# Patient Record
Sex: Male | Born: 1960 | Race: Black or African American | Hispanic: No | Marital: Single | Smoking: Never smoker
Health system: Southern US, Community
[De-identification: ages and names within clinical notes are randomized; demographics above are authoritative.]

## PROBLEM LIST (undated history)

## (undated) DIAGNOSIS — I10 Essential (primary) hypertension: Secondary | ICD-10-CM

## (undated) DIAGNOSIS — C801 Malignant (primary) neoplasm, unspecified: Secondary | ICD-10-CM

## (undated) DIAGNOSIS — N289 Disorder of kidney and ureter, unspecified: Secondary | ICD-10-CM

## (undated) HISTORY — PX: TRANSPLANTATION RENAL: SUR1385

## (undated) NOTE — Telephone Encounter (Signed)
 Formatting of this note might be different from the original. Patient has [facility] and is not able to use our center.  Electronically signed by Trey Setter at 08/30/2021  2:15 PM CDT

---

## 2000-09-23 ENCOUNTER — Encounter: Payer: Self-pay | Admitting: Emergency Medicine

## 2000-09-23 ENCOUNTER — Emergency Department (HOSPITAL_COMMUNITY): Admission: EM | Admit: 2000-09-23 | Discharge: 2000-09-23 | Payer: Self-pay | Admitting: Emergency Medicine

## 2000-10-22 ENCOUNTER — Encounter: Admission: RE | Admit: 2000-10-22 | Discharge: 2000-10-22 | Payer: Self-pay | Admitting: Family Medicine

## 2000-10-22 ENCOUNTER — Encounter: Payer: Self-pay | Admitting: Family Medicine

## 2000-10-30 ENCOUNTER — Encounter: Admission: RE | Admit: 2000-10-30 | Discharge: 2000-12-20 | Payer: Self-pay | Admitting: *Deleted

## 2001-11-12 ENCOUNTER — Encounter: Payer: Self-pay | Admitting: Orthopaedic Surgery

## 2001-11-12 ENCOUNTER — Encounter: Admission: RE | Admit: 2001-11-12 | Discharge: 2001-11-12 | Payer: Self-pay | Admitting: Orthopaedic Surgery

## 2004-07-05 ENCOUNTER — Ambulatory Visit: Payer: Self-pay | Admitting: Nurse Practitioner

## 2004-07-05 ENCOUNTER — Ambulatory Visit: Payer: Self-pay | Admitting: *Deleted

## 2004-07-06 ENCOUNTER — Ambulatory Visit: Payer: Self-pay | Admitting: Nurse Practitioner

## 2006-05-16 ENCOUNTER — Emergency Department (HOSPITAL_COMMUNITY): Admission: EM | Admit: 2006-05-16 | Discharge: 2006-05-16 | Payer: Self-pay | Admitting: Family Medicine

## 2006-05-18 ENCOUNTER — Emergency Department (HOSPITAL_COMMUNITY): Admission: EM | Admit: 2006-05-18 | Discharge: 2006-05-18 | Payer: Self-pay | Admitting: Family Medicine

## 2006-05-20 ENCOUNTER — Emergency Department (HOSPITAL_COMMUNITY): Admission: EM | Admit: 2006-05-20 | Discharge: 2006-05-20 | Payer: Self-pay | Admitting: Family Medicine

## 2006-05-31 ENCOUNTER — Emergency Department (HOSPITAL_COMMUNITY): Admission: EM | Admit: 2006-05-31 | Discharge: 2006-05-31 | Payer: Self-pay | Admitting: Emergency Medicine

## 2007-10-14 ENCOUNTER — Emergency Department (HOSPITAL_COMMUNITY): Admission: EM | Admit: 2007-10-14 | Discharge: 2007-10-14 | Payer: Self-pay | Admitting: Family Medicine

## 2007-10-18 ENCOUNTER — Ambulatory Visit: Payer: Self-pay | Admitting: Internal Medicine

## 2007-10-31 ENCOUNTER — Ambulatory Visit: Payer: Self-pay | Admitting: Internal Medicine

## 2007-11-04 ENCOUNTER — Ambulatory Visit: Payer: Self-pay | Admitting: Internal Medicine

## 2007-12-09 ENCOUNTER — Ambulatory Visit: Payer: Self-pay | Admitting: Internal Medicine

## 2008-06-01 ENCOUNTER — Ambulatory Visit: Payer: Self-pay | Admitting: Internal Medicine

## 2008-06-01 LAB — CONVERTED CEMR LAB
ALT: 21 units/L (ref 0–53)
AST: 17 units/L (ref 0–37)
Albumin: 4.6 g/dL (ref 3.5–5.2)
Alkaline Phosphatase: 75 units/L (ref 39–117)
BUN: 24 mg/dL — ABNORMAL HIGH (ref 6–23)
CO2: 21 meq/L (ref 19–32)
Calcium: 9.9 mg/dL (ref 8.4–10.5)
Chloride: 107 meq/L (ref 96–112)
Creatinine, Ser: 1.98 mg/dL — ABNORMAL HIGH (ref 0.40–1.50)
Glucose, Bld: 101 mg/dL — ABNORMAL HIGH (ref 70–99)
Potassium: 4.5 meq/L (ref 3.5–5.3)
Sodium: 143 meq/L (ref 135–145)
Total Bilirubin: 0.3 mg/dL (ref 0.3–1.2)
Total Protein: 8 g/dL (ref 6.0–8.3)

## 2009-10-13 ENCOUNTER — Ambulatory Visit: Payer: Self-pay | Admitting: Internal Medicine

## 2009-10-13 LAB — CONVERTED CEMR LAB
BUN: 30 mg/dL — ABNORMAL HIGH (ref 6–23)
CO2: 22 meq/L (ref 19–32)
Calcium: 9.9 mg/dL (ref 8.4–10.5)
Chloride: 112 meq/L (ref 96–112)
Cholesterol: 230 mg/dL — ABNORMAL HIGH (ref 0–200)
Creatinine, Ser: 2.38 mg/dL — ABNORMAL HIGH (ref 0.40–1.50)
Glucose, Bld: 85 mg/dL (ref 70–99)
HDL: 61 mg/dL (ref >39)
LDL Cholesterol: 105 mg/dL — ABNORMAL HIGH (ref 0–99)
Microalb, Ur: 65.83 mg/dL — ABNORMAL HIGH (ref 0.00–1.89)
Potassium: 5.1 meq/L (ref 3.5–5.3)
Sodium: 144 meq/L (ref 135–145)
Total CHOL/HDL Ratio: 3.8
Triglycerides: 321 mg/dL — ABNORMAL HIGH (ref <150)
VLDL: 64 mg/dL — ABNORMAL HIGH (ref 0–40)

## 2010-12-28 LAB — COMPREHENSIVE METABOLIC PANEL
ALT: 45
AST: 33
Albumin: 4.3
Alkaline Phosphatase: 67
BUN: 18
CO2: 33 — ABNORMAL HIGH
Calcium: 10.3
Chloride: 102
Creatinine, Ser: 2.06 — ABNORMAL HIGH
GFR calc Af Amer: 42 — ABNORMAL LOW
GFR calc non Af Amer: 35 — ABNORMAL LOW
Glucose, Bld: 99
Potassium: 4.1
Sodium: 141
Total Bilirubin: 0.9
Total Protein: 8.1

## 2010-12-28 LAB — DIFFERENTIAL
Basophils Absolute: 0
Basophils Relative: 1
Eosinophils Absolute: 0.1
Eosinophils Relative: 1
Monocytes Absolute: 0.7
Neutro Abs: 4

## 2010-12-28 LAB — CBC
HCT: 48.8
Hemoglobin: 17.5 — ABNORMAL HIGH
MCHC: 35.7
MCV: 96
Platelets: 256
RBC: 5.09
RDW: 13.5
WBC: 6.2

## 2010-12-28 LAB — LIPID PANEL
Cholesterol: 215 — ABNORMAL HIGH
HDL: 78
LDL Cholesterol: 91
Triglycerides: 230 — ABNORMAL HIGH

## 2014-07-22 ENCOUNTER — Emergency Department (HOSPITAL_COMMUNITY): Payer: Worker's Compensation

## 2014-07-22 ENCOUNTER — Encounter (HOSPITAL_COMMUNITY): Payer: Self-pay | Admitting: Emergency Medicine

## 2014-07-22 ENCOUNTER — Telehealth: Payer: Self-pay | Admitting: Surgery

## 2014-07-22 ENCOUNTER — Emergency Department (HOSPITAL_COMMUNITY)
Admission: EM | Admit: 2014-07-22 | Discharge: 2014-07-22 | Disposition: A | Discharge status: Home / Self Care | Payer: Worker's Compensation | Attending: Emergency Medicine | Admitting: Emergency Medicine

## 2014-07-22 DIAGNOSIS — R55 Syncope and collapse: Secondary | ICD-10-CM | POA: Insufficient documentation

## 2014-07-22 DIAGNOSIS — Z859 Personal history of malignant neoplasm, unspecified: Secondary | ICD-10-CM | POA: Insufficient documentation

## 2014-07-22 DIAGNOSIS — I10 Essential (primary) hypertension: Secondary | ICD-10-CM | POA: Insufficient documentation

## 2014-07-22 HISTORY — DX: Malignant (primary) neoplasm, unspecified: C80.1

## 2014-07-22 HISTORY — DX: Disorder of kidney and ureter, unspecified: N28.9

## 2014-07-22 HISTORY — DX: Essential (primary) hypertension: I10

## 2014-07-22 LAB — CBC WITH DIFFERENTIAL/PLATELET
BASOS ABS: 0 10*3/uL (ref 0.0–0.1)
BASOS PCT: 0 % (ref 0–1)
EOS ABS: 0 10*3/uL (ref 0.0–0.7)
EOS PCT: 1 % (ref 0–5)
HEMATOCRIT: 31.4 % — AB (ref 39.0–52.0)
Hemoglobin: 11 g/dL — ABNORMAL LOW (ref 13.0–17.0)
Lymphocytes Relative: 16 % (ref 12–46)
Lymphs Abs: 1 10*3/uL (ref 0.7–4.0)
MCH: 31.2 pg (ref 26.0–34.0)
MCHC: 35 g/dL (ref 30.0–36.0)
MCV: 89 fL (ref 78.0–100.0)
MONO ABS: 0.5 10*3/uL (ref 0.1–1.0)
MONOS PCT: 8 % (ref 3–12)
Neutro Abs: 4.5 10*3/uL (ref 1.7–7.7)
Neutrophils Relative %: 75 % (ref 43–77)
Platelets: 274 10*3/uL (ref 150–400)
RBC: 3.53 MIL/uL — ABNORMAL LOW (ref 4.22–5.81)
RDW: 14.5 % (ref 11.5–15.5)
WBC: 6 10*3/uL (ref 4.0–10.5)

## 2014-07-22 LAB — COMPREHENSIVE METABOLIC PANEL
ALBUMIN: 3.5 g/dL (ref 3.5–5.2)
ALT: 33 U/L (ref 0–53)
ANION GAP: 9 (ref 5–15)
AST: 32 U/L (ref 0–37)
Alkaline Phosphatase: 83 U/L (ref 39–117)
BUN: 62 mg/dL — ABNORMAL HIGH (ref 6–23)
CALCIUM: 9.2 mg/dL (ref 8.4–10.5)
CO2: 24 mmol/L (ref 19–32)
Chloride: 103 mmol/L (ref 96–112)
Creatinine, Ser: 7.85 mg/dL — ABNORMAL HIGH (ref 0.50–1.35)
GFR calc non Af Amer: 7 mL/min — ABNORMAL LOW (ref >90)
GFR, EST AFRICAN AMERICAN: 8 mL/min — AB (ref >90)
GLUCOSE: 97 mg/dL (ref 70–99)
POTASSIUM: 5.6 mmol/L — AB (ref 3.5–5.1)
SODIUM: 136 mmol/L (ref 135–145)
TOTAL PROTEIN: 6.8 g/dL (ref 6.0–8.3)
Total Bilirubin: 0.4 mg/dL (ref 0.3–1.2)

## 2014-07-22 LAB — POTASSIUM: POTASSIUM: 4.6 mmol/L (ref 3.5–5.1)

## 2014-07-22 MED ORDER — SODIUM CHLORIDE 0.9 % IV BOLUS (SEPSIS)
500.0000 mL | Freq: Once | INTRAVENOUS | Status: AC
  Administered 2014-07-22: 500 mL via INTRAVENOUS

## 2014-07-22 MED ORDER — ACETAMINOPHEN 325 MG PO TABS
650.0000 mg | ORAL_TABLET | Freq: Once | ORAL | Status: AC
  Administered 2014-07-22: 650 mg via ORAL
  Filled 2014-07-22: qty 2

## 2014-07-22 MED ORDER — OXYCODONE-ACETAMINOPHEN 5-325 MG PO TABS: 2.0000 | ORAL_TABLET | ORAL | Status: DC | PRN

## 2014-07-22 MED ORDER — FENTANYL CITRATE (PF) 100 MCG/2ML IJ SOLN
50.0000 ug | Freq: Once | INTRAMUSCULAR | Status: AC
  Administered 2014-07-22: 50 ug via INTRAVENOUS
  Filled 2014-07-22: qty 2

## 2014-07-22 NOTE — Telephone Encounter (Signed)
Patient came to ED to pick up prescription. ID presented, prescription given

## 2014-07-22 NOTE — ED Provider Notes (Signed)
CSN: [id number]     Arrival date & time 07/22/14  2952 History   First MD Initiated Contact with Patient 07/22/14 [phone]     Chief Complaint  Patient presents with  . Loss of Consciousness  . Dizziness     (Consider location/radiation/quality/duration/timing/severity/associated sxs/prior Treatment) Patient is a 54 y.o. male presenting with syncope and dizziness. The history is provided by the patient.  Loss of Consciousness Episode history:  Single Most recent episode:  Today Timing:  Rare Progression:  Resolved Chronicity:  Recurrent Context comment:  While cleaning a floor Witnessed: no   Relieved by:  Nothing Worsened by:  Nothing tried Ineffective treatments:  None tried Associated symptoms: dizziness   Associated symptoms: no chest pain, no fever, no headaches, no nausea, no shortness of breath and no vomiting   Dizziness Associated symptoms: syncope   Associated symptoms: no chest pain, no diarrhea, no headaches, no nausea, no shortness of breath and no vomiting     Past Medical History  Diagnosis Date  . Renal disorder   . Hypertension   . Cancer    History reviewed. No pertinent past surgical history. No family history on file. History  Substance Use Topics  . Smoking status: Never Smoker   . Smokeless tobacco: Not on file  . Alcohol Use: No    Review of Systems  Constitutional: Negative for fever.  HENT: Negative for drooling and rhinorrhea.   Eyes: Negative for pain.  Respiratory: Negative for cough and shortness of breath.   Cardiovascular: Positive for syncope. Negative for chest pain and leg swelling.  Gastrointestinal: Negative for nausea, vomiting, abdominal pain and diarrhea.  Genitourinary: Negative for dysuria and hematuria.  Musculoskeletal: Negative for gait problem and neck pain.  Skin: Negative for color change.  Neurological: Positive for dizziness and syncope. Negative for numbness and headaches.  Hematological: Negative for adenopathy.   Psychiatric/Behavioral: Negative for behavioral problems.  All other systems reviewed and are negative.     Allergies  Review of patient's allergies indicates not on file.  Home Medications   Prior to Admission medications   Not on File   BP 142/83 mmHg  Temp(Src) 97.8 F (36.6 C) (Oral)  Resp 17  SpO2 100% Physical Exam  Constitutional: He is oriented to person, place, and time. He appears well-developed and well-nourished.  HENT:  Head: Normocephalic and atraumatic.  Right Ear: External ear normal.  Left Ear: External ear normal.  Nose: Nose normal.  Mouth/Throat: Oropharynx is clear and moist. No oropharyngeal exudate.  Eyes: Conjunctivae and EOM are normal. Pupils are equal, round, and reactive to light.  Neck: Normal range of motion. Neck supple.  Cardiovascular: Normal rate, regular rhythm, normal heart sounds and intact distal pulses.  Exam reveals no gallop and no friction rub.   No murmur heard. Pulmonary/Chest: Effort normal and breath sounds normal. No respiratory distress. He has no wheezes.  Abdominal: Soft. Bowel sounds are normal. He exhibits no distension. There is no tenderness. There is no rebound and no guarding.  Musculoskeletal: Normal range of motion. He exhibits tenderness. He exhibits no edema.  Mild diffuse tenderness to the left paraspinal area of the cervical thoracic and lumbar region. No focal vertebral tenderness is noted. Normal range of motion of the neck.  Neurological: He is alert and oriented to person, place, and time.  alert, oriented x3 speech: normal in context and clarity memory: intact grossly cranial nerves II-XII: intact motor strength: full proximally and distally no involuntary movements or tremors sensation:  intact to light touch diffusely  cerebellar: finger-to-nose and heel-to-shin intact gait: normal forwards and backwards  Skin: Skin is warm and dry.  Psychiatric: He has a normal mood and affect. His behavior is normal.   Nursing note and vitals reviewed.   ED Course  Procedures (including critical care time) Labs Review Labs Reviewed  CBC WITH DIFFERENTIAL/PLATELET - Abnormal; Notable for the following:    RBC 3.53 (*)    Hemoglobin 11.0 (*)    HCT 31.4 (*)    All other components within normal limits  COMPREHENSIVE METABOLIC PANEL - Abnormal; Notable for the following:    Potassium 5.6 (*)    BUN 62 (*)    Creatinine, Ser 7.85 (*)    GFR calc non Af Amer 7 (*)    GFR calc Af Amer 8 (*)    All other components within normal limits  POTASSIUM    Imaging Review Dg Chest 2 View  07/22/2014   CLINICAL DATA:  Syncope at work today, history renal disorder, hypertension; EHR states "cancer" but this is not otherwise specified.  EXAM: CHEST  2 VIEW  COMPARISON:  None  FINDINGS: Normal heart size, mediastinal contours, and pulmonary vascularity.  Minimal peribronchial thickening.  No acute infiltrate, pleural effusion or pneumothorax.  Extrapleural density at lateral LEFT upper hemithorax, 3.2 x 1.8 cm, uncertain etiology.  No definite bone destruction identified.  Scattered endplate spur formation thoracic spine.  IMPRESSION: Minimal bronchitic changes with an extrapleural appearing density at the lateral LEFT apex 3.2 x 1.8 cm in size, of uncertain etiology ; CT chest with contrast recommended to assess.   Electronically Signed   By: Lavonia Dana M.D.   On: 07/22/2014 09:47   Ct Chest Wo Contrast  07/22/2014   CLINICAL DATA:  Post follow with back and neck pain. Abnormality noted on recent chest x-[facility] suggesting left upper thoracic pleural-based mass.  EXAM: CT CHEST WITHOUT CONTRAST  TECHNIQUE: Multidetector CT imaging of the chest was performed following the standard protocol without IV contrast.  COMPARISON:  Chest x-[facility] 07/22/2014  FINDINGS: Lungs are adequately inflated without consolidation or effusion. There is a smooth bordered pleural mass over the lateral left upper thorax with homogeneous fat density  measuring 2.1 x 3 cm in transverse in AP dimension compatible a pleural lipoma. Airways are normal.  Heart is normal in size. Remaining mediastinal structures are within normal.  Images through the upper abdomen demonstrate multiple simple renal cysts. There are mild degenerative changes of the spine.  IMPRESSION: No acute cardiopulmonary disease.  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  Bilateral renal cysts.   Electronically Signed   By: Marin Olp M.D.   On: 07/22/2014 13:40     EKG Interpretation   Date/Time:  Thursday July 22 2014 08:46:28 EDT Ventricular Rate:  67 PR Interval:  188 QRS Duration: 104 QT Interval:  411 QTC Calculation: 434 R Axis:   79 Text Interpretation:  Sinus rhythm Confirmed by Diahann Guajardo  MD, Lenola Lockner  ([id number]) on 07/22/2014 9:03:36 AM      MDM   Final diagnoses:  Syncope    9:00 AM 54 y.o. male with a history of hypertension, renal disease, multiple myeloma who presents with a syncopal episode. He states that he was at work cleaning floors and was walking to empty a bucket of water. He began feeling lightheaded and felt like he was going to pass out. He states that he syncopized before he could  sit down. He has had several similar episodes in the past and syncopized several months ago. He currently complains of some left-sided back pain and a mild headache. He is afebrile and vital signs are unremarkable here. He has a normal neurologic exam. We'll get screening labs and imaging.  Mass seen on CXR. Not noted on previous CXR at [facility] last year. CT chest w/out contrast to eval which shows pleural lipoma.    3:13 PM: Pt continues to appear well. Remains asx. Syncope w/ prodrome, similar to previous. I planned on writing him some percocet for his back pain related to the fall, but forgot and he left before I could give him the Rx. I have discussed the diagnosis/risks/treatment options with the patient and believe the pt to be  eligible for discharge home to follow-up with his pcp. We also discussed returning to the ED immediately if new or worsening sx occur. We discussed the sx which are most concerning (e.g., further syncope, cp, sob) that necessitate immediate return. Medications administered to the patient during their visit and any new prescriptions provided to the patient are listed below.  Medications given during this visit Medications  sodium chloride 0.9 % bolus 500 mL (0 mLs Intravenous Stopped 07/22/14 1100)  acetaminophen (TYLENOL) tablet 650 mg (650 mg Oral Given 07/22/14 1121)  fentaNYL (SUBLIMAZE) injection 50 mcg (50 mcg Intravenous Given 07/22/14 1121)    Discharge Medication List as of 07/22/2014  3:12 PM       Pamella Pert, MD 07/22/14 1547

## 2014-07-22 NOTE — Telephone Encounter (Signed)
ED CM received call from patient regarding pain prescription that he was supposed to receive. CM reviewed record, noted provider forgot to give patient prescription.  Discussed with Dr, Jeneen Rinks, Prescription for Percocet printed by Dr.Trip. Patient made aware that he would need to come back to pick prescription up, patient agreeable.

## 2014-07-22 NOTE — ED Notes (Signed)
Per EMS, pt coming in for evaluation of syncopal episode after an episode of dizziness at work while bending over. Pt has hx of the same and has been evaluated. Pt alert x 4 with no focal weakness noted by EMS. Pt did fall onto his left side and reports mild left side neck and shoulder pain.

## 2014-07-22 NOTE — ED Notes (Signed)
Pt A&OX4, ambulatory at d/c with steady gait, NAD 

## 2014-07-22 NOTE — ED Notes (Signed)
Patient returned from CT

## 2014-07-22 NOTE — ED Provider Notes (Signed)
I was approached by Case Manager that patient left for given prescription. I reviewed Dr. Ruthe Mannan note. It is clear that this is the case. Patient given prescription for #20, 5/325 Percocet by me.  Tanna Furry, MD 07/22/14 1816

## 2014-07-30 ENCOUNTER — Emergency Department (HOSPITAL_COMMUNITY): Payer: BC Managed Care – PPO

## 2014-07-30 ENCOUNTER — Encounter (HOSPITAL_COMMUNITY): Payer: Self-pay | Admitting: Family Medicine

## 2014-07-30 ENCOUNTER — Emergency Department (HOSPITAL_COMMUNITY)
Admission: EM | Admit: 2014-07-30 | Discharge: 2014-07-30 | Disposition: A | Discharge status: Home / Self Care | Payer: BC Managed Care – PPO | Attending: Emergency Medicine | Admitting: Emergency Medicine

## 2014-07-30 DIAGNOSIS — F1210 Cannabis abuse, uncomplicated: Secondary | ICD-10-CM | POA: Insufficient documentation

## 2014-07-30 DIAGNOSIS — Y93E5 Activity, floor mopping and cleaning: Secondary | ICD-10-CM | POA: Insufficient documentation

## 2014-07-30 DIAGNOSIS — M791 Myalgia, unspecified site: Secondary | ICD-10-CM

## 2014-07-30 DIAGNOSIS — S3992XA Unspecified injury of lower back, initial encounter: Secondary | ICD-10-CM | POA: Insufficient documentation

## 2014-07-30 DIAGNOSIS — M545 Low back pain, unspecified: Secondary | ICD-10-CM

## 2014-07-30 DIAGNOSIS — Y929 Unspecified place or not applicable: Secondary | ICD-10-CM | POA: Insufficient documentation

## 2014-07-30 DIAGNOSIS — S0990XA Unspecified injury of head, initial encounter: Secondary | ICD-10-CM | POA: Insufficient documentation

## 2014-07-30 DIAGNOSIS — W010XXA Fall on same level from slipping, tripping and stumbling without subsequent striking against object, initial encounter: Secondary | ICD-10-CM | POA: Diagnosis not present

## 2014-07-30 DIAGNOSIS — Z79899 Other long term (current) drug therapy: Secondary | ICD-10-CM | POA: Insufficient documentation

## 2014-07-30 DIAGNOSIS — I10 Essential (primary) hypertension: Secondary | ICD-10-CM | POA: Diagnosis not present

## 2014-07-30 DIAGNOSIS — S29001A Unspecified injury of muscle and tendon of front wall of thorax, initial encounter: Secondary | ICD-10-CM | POA: Diagnosis not present

## 2014-07-30 DIAGNOSIS — Z87448 Personal history of other diseases of urinary system: Secondary | ICD-10-CM | POA: Insufficient documentation

## 2014-07-30 DIAGNOSIS — S199XXA Unspecified injury of neck, initial encounter: Secondary | ICD-10-CM | POA: Diagnosis not present

## 2014-07-30 DIAGNOSIS — Y990 Civilian activity done for income or pay: Secondary | ICD-10-CM | POA: Insufficient documentation

## 2014-07-30 DIAGNOSIS — Z859 Personal history of malignant neoplasm, unspecified: Secondary | ICD-10-CM | POA: Insufficient documentation

## 2014-07-30 DIAGNOSIS — R52 Pain, unspecified: Secondary | ICD-10-CM

## 2014-07-30 DIAGNOSIS — W19XXXA Unspecified fall, initial encounter: Secondary | ICD-10-CM

## 2014-07-30 LAB — URINALYSIS, ROUTINE W REFLEX MICROSCOPIC
BILIRUBIN URINE: NEGATIVE
Glucose, UA: NEGATIVE mg/dL
Ketones, ur: NEGATIVE mg/dL
Leukocytes, UA: NEGATIVE
NITRITE: NEGATIVE
PH: 5.5 (ref 5.0–8.0)
SPECIFIC GRAVITY, URINE: 1.012 (ref 1.005–1.030)
Urobilinogen, UA: 0.2 mg/dL (ref 0.0–1.0)

## 2014-07-30 LAB — RAPID URINE DRUG SCREEN, HOSP PERFORMED
AMPHETAMINES: NOT DETECTED
BENZODIAZEPINES: NOT DETECTED
Barbiturates: NOT DETECTED
COCAINE: NOT DETECTED
Opiates: NOT DETECTED
Tetrahydrocannabinol: POSITIVE — AB

## 2014-07-30 LAB — URINE MICROSCOPIC-ADD ON

## 2014-07-30 MED ORDER — TRAMADOL HCL 50 MG PO TABS
50.0000 mg | ORAL_TABLET | Freq: Two times a day (BID) | ORAL | Status: DC | PRN
Start: 2014-07-30 — End: 2016-10-14

## 2014-07-30 NOTE — Discharge Instructions (Signed)
Please call your doctor for a followup appointment within 24-48 hours. When you talk to your doctor please let them know that you were seen in the emergency department and have them acquire all of your records so that they can discuss the findings with you and formulate a treatment plan to fully care for your new and ongoing problems. Please call and set-up an appointment with your primary care provider Please call and set-up an appointment with orthopedics  Please take medications as prescribed - while on pain medications there is to be no drinking alcohol, driving, operating any heavy machinery. If extra please dispose in a proper manner. Please do not take any extra Tylenol with this medication for this can lead to Tylenol overdose and liver issues.  Please rest and stay hydrated Please avoid any physical or strenuous activity  Please rest and apply icy hot ointment  Please continue to monitor symptoms closely and if symptoms are to worsen or change (fever greater than 101, chills, sweating, nausea, vomiting, chest pain, shortness of breathe, difficulty breathing, weakness, numbness, tingling, worsening or changes to pain pattern, fainting, blurred vision, sudden loss of vision, numbness and tingling, loss of sensation, inability to control urine or bowel movements, fall, injury) please report back to the Emergency Department immediately.   Back Pain, Adult Low back pain is very common. About 1 in 5 people have back pain.The cause of low back pain is rarely dangerous. The pain often gets better over time.About half of people with a sudden onset of back pain feel better in just 2 weeks. About 8 in 10 people feel better by 6 weeks.  CAUSES Some common causes of back pain include:  Strain of the muscles or ligaments supporting the spine.  Wear and tear (degeneration) of the spinal discs.  Arthritis.  Direct injury to the back. DIAGNOSIS Most of the time, the direct cause of low back pain is  not known.However, back pain can be treated effectively even when the exact cause of the pain is unknown.Answering your caregiver's questions about your overall health and symptoms is one of the most accurate ways to make sure the cause of your pain is not dangerous. If your caregiver needs more information, he or she may order lab work or imaging tests (X-rays or MRIs).However, even if imaging tests show changes in your back, this usually does not require surgery. HOME CARE INSTRUCTIONS For many people, back pain returns.Since low back pain is rarely dangerous, it is often a condition that people can learn to [facility] their own.   Remain active. It is stressful on the back to sit or stand in one place. Do not sit, drive, or stand in one place for more than 30 minutes at a time. Take short walks on level surfaces as soon as pain allows.Try to increase the length of time you walk each day.  Do not stay in bed.Resting more than 1 or 2 days can delay your recovery.  Do not avoid exercise or work.Your body is made to move.It is not dangerous to be active, even though your back may hurt.Your back will likely heal faster if you return to being active before your pain is gone.  Pay attention to your body when you bend and lift. Many people have less discomfortwhen lifting if they bend their knees, keep the load close to their bodies,and avoid twisting. Often, the most comfortable positions are those that put less stress on your recovering back.  Find a comfortable position to  sleep. Use a firm mattress and lie on your side with your knees slightly bent. If you lie on your back, put a pillow under your knees.  Only take over-the-counter or prescription medicines as directed by your caregiver. Over-the-counter medicines to reduce pain and inflammation are often the most helpful.Your caregiver may prescribe muscle relaxant drugs.These medicines help dull your pain so you can more quickly return to  your normal activities and healthy exercise.  Put ice on the injured area.  Put ice in a plastic bag.  Place a towel between your skin and the bag.  Leave the ice on for 15-20 minutes, 03-04 times a day for the first 2 to 3 days. After that, ice and heat may be alternated to reduce pain and spasms.  Ask your caregiver about trying back exercises and gentle massage. This may be of some benefit.  Avoid feeling anxious or stressed.Stress increases muscle tension and can worsen back pain.It is important to recognize when you are anxious or stressed and learn ways to manage it.Exercise is a great option. SEEK MEDICAL CARE IF:  You have pain that is not relieved with rest or medicine.  You have pain that does not improve in 1 week.  You have new symptoms.  You are generally not feeling well. SEEK IMMEDIATE MEDICAL CARE IF:   You have pain that radiates from your back into your legs.  You develop new bowel or bladder control problems.  You have unusual weakness or numbness in your arms or legs.  You develop nausea or vomiting.  You develop abdominal pain.  You feel faint. Document Released: 03/19/2005 Document Revised: 09/18/2011 Document Reviewed: 07/21/2013 [facility] Patient Information 2015 [place], [place]. This information is not intended to replace advice given to you by your health care provider. Make sure you discuss any questions you have with your health care provider. Muscle Pain Muscle pain (myalgia) may be caused by many things, including:  Overuse or muscle strain, especially if you are not in shape. This is the most common cause of muscle pain.  Injury.  Bruises.  Viruses, such as the flu.  Infectious diseases.  Fibromyalgia, which is a chronic condition that causes muscle tenderness, fatigue, and headache.  Autoimmune diseases, including lupus.  Certain drugs, including ACE inhibitors and statins. Muscle pain may be mild or severe. In most cases, the  pain lasts only a short time and goes away without treatment. To diagnose the cause of your muscle pain, your health care provider will take your medical history. This means he or she will ask you when your muscle pain began and what has been happening. If you have not had muscle pain for very long, your health care provider may want to wait before doing much testing. If your muscle pain has lasted a long time, your health care provider may want to run tests right away. If your health care provider thinks your muscle pain may be caused by illness, you may need to have additional tests to rule out certain conditions.  Treatment for muscle pain depends on the cause. Home care is often enough to relieve muscle pain. Your health care provider may also prescribe anti-inflammatory medicine. HOME CARE INSTRUCTIONS Watch your condition for any changes. The following actions may help to lessen any discomfort you are feeling:  Only take over-the-counter or prescription medicines as directed by your health care provider.  Apply ice to the sore muscle:  Put ice in a plastic bag.  Place a towel between  your skin and the bag.  Leave the ice on for 15-20 minutes, 3-4 times a day.  You may alternate applying hot and cold packs to the muscle as directed by your health care provider.  If overuse is causing your muscle pain, slow down your activities until the pain goes away.  Remember that it is normal to feel some muscle pain after starting a workout program. Muscles that have not been used often will be sore at first.  Do regular, gentle exercises if you are not usually active.  Warm up before exercising to lower your risk of muscle pain.  Do not continue working out if the pain is very bad. Bad pain could mean you have injured a muscle. SEEK MEDICAL CARE IF:  Your muscle pain gets worse, and medicines do not help.  You have muscle pain that lasts longer than 3 days.  You have a rash or fever along  with muscle pain.  You have muscle pain after a tick bite.  You have muscle pain while working out, even though you are in good physical condition.  You have redness, soreness, or swelling along with muscle pain.  You have muscle pain after starting a new medicine or changing the dose of a medicine. SEEK IMMEDIATE MEDICAL CARE IF:  You have trouble breathing.  You have trouble swallowing.  You have muscle pain along with a stiff neck, fever, and vomiting.  You have severe muscle weakness or cannot move part of your body. MAKE SURE YOU:   Understand these instructions.  Will watch your condition.  Will get help right away if you are not doing well or get worse. Document Released: 02/08/2006 Document Revised: 03/24/2013 Document Reviewed: 01/13/2013 [facility] Patient Information 2015 [place], [place]. This information is not intended to replace advice given to you by your health care provider. Make sure you discuss any questions you have with your health care provider.   Emergency Department Resource Guide 1) Find a Doctor and Pay Out of Pocket Although you won't have to find out who is covered by your insurance plan, it is a good idea to ask around and get recommendations. You will then need to call the office and see if the doctor you have chosen will accept you as a new patient and what types of options they offer for patients who are self-pay. Some doctors offer discounts or will set up payment plans for their patients who do not have insurance, but you will need to ask so you aren't surprised when you get to your appointment.  2) Contact Your Local Health Department Not all health departments have doctors that can see patients for sick visits, but many do, so it is worth a call to see if yours does. If you don't know where your local health department is, you can check in your phone book. The CDC also has a tool to help you locate your state's health department, and many state  websites also have listings of all of their local health departments.  3) Find a [facility] If your illness is not likely to be very severe or complicated, you may want to try a walk in clinic. These are popping up all over the country in pharmacies, drugstores, and shopping centers. They're usually staffed by nurse practitioners or physician assistants that have been trained to treat common illnesses and complaints. They're usually fairly quick and inexpensive. However, if you have serious medical issues or chronic medical problems, these are probably not your  best option.  No Primary Care Doctor: - Call Health Connect at  [phone] - they can help you locate a primary care doctor that  accepts your insurance, provides certain services, etc. - Physician Referral Service- [phone]  Chronic Pain Problems: Organization         Address  Phone   Notes  [facility]  [phone] Patients need to be referred by their primary care doctor.   Medication Assistance: Organization         Address  Phone   Notes  [facility] Medication [facility] [address] [place], [place] [address] [phone] --Must be a resident of [facility] -- Must have NO insurance coverage whatsoever (no Medicaid/ Medicare, etc.) -- The pt. MUST have a primary care doctor that directs their care regularly and follows them in the community   MedAssist  [phone]   [employer]  [phone]    Agencies that provide inexpensive medical care: Organization         Address  Phone   Notes  [place]  [phone]   [facility]    [phone]   [facility] [place], [place] [address] [phone]   [place] [address], [place] [phone]   Planned Parenthood    [phone]   [facility]    [phone]   [place] and [place] [address], [place] Phone:  [phone], Fax:  [phone] Hours of Operation:  9 am - 6 pm, M-F.  Also accepts Medicaid/Medicare and self-pay.  [facility] for [place], [address], [place] Phone: [phone], Fax: [phone]. Hours of Operation:  8:30 am - 5:30 pm, M-F.  Also accepts Medicaid and self-pay.  [facility] [address] [place] Phone: [phone]   [place], [place], [place] [phone], Ext. [phone] Mondays & Thursdays: 7-9 AM.  First 15 patients are seen on a first come, first serve basis.    [place] Providers:  Organization         Address  Phone   Notes  [facility] [address], [place] [phone] Also accepts self-pay patients.  [facility] [id number] [place], [place]  [phone]   [place], [address], [place] [phone]   [facility] [address], [place] [phone]   Lucianne Lei [address], [place]   [phone] Only accepts [place] Access [place] patients after they have their name applied to their card.   Self-Pay (no insurance) in [facility]:  Organization         Address  Phone   Notes  Sickle Cell Patients, [facility] [place] [phone]   [facility] [place] [phone]   [facility] [place]  [place], [place], [place] [phone]   [facility]/Dr. Osei-Bonsu  [address], [place] or [address], [place] [phone] Phone number for both [place] and [place] locations is the same.  [facility] [address], [place] (  [place]) [phone]   [place], [place] or [address] [phone]   [facility] [address] [place] [phone], phone; [phone], fax Sees patients 1st and 3rd Saturday of every month.  Must not qualify for public or private insurance (i.e. Medicaid, Medicare, [place] Health Choice, Veterans' Benefits)  Household income should be no more than 200% of the poverty level The clinic cannot treat you if you are pregnant or think you are pregnant  Sexually transmitted diseases are not treated at the clinic.    Dental Care: Organization         Address  Phone  Notes  [facility] Department of [facility] [place] [phone] Accepts children up to age 22 who are enrolled in [place] or [place]; pregnant women with a Medicaid card; and children who have applied for Medicaid or [place] Health Choice, but were declined, whose parents can pay a reduced fee at time of service.  [facility] Department of [facility]  [address], [place] [phone] Accepts children up to age 55 who are enrolled in [place] or [place]; pregnant women with a Medicaid card; and children who have applied for Medicaid or  Health Choice, but were declined, whose parents can pay a reduced fee at time of service.  [facility] Adult [facility] Access PRO[facility]  [place] [phone] Patients are seen by appointment only. Walk-ins are not accepted. [place] will see patients 50 years of age and older. Monday - Tuesday (8am-5pm) Most Wednesdays (8:30-5pm) $30 per visit, cash only  [facility]  [address], [facility] [phone] Patients are seen by appointment only. Walk-ins are not accepted. [place] will see patients 61 years of age and older. One Wednesday Evening (Monthly: Volunteer Based).  $30 per visit, cash only  [place]  [phone] for adults; Children under age 75, call Graduate Pediatric Dentistry at [phone]. Children aged 68-14, please call [phone] to request a pediatric application.  Dental services are provided in all areas of dental care including fillings, crowns and bridges, complete and partial dentures, implants, gum treatment, root canals, and extractions. Preventive care is also provided. Treatment is provided to both adults and children. Patients are selected via a lottery and there is often a waiting list.   [facility] [address], [place]  [phone] [email or web address]   [facility] [address] [place], [place] [phone], Ext. [phone] Second and Fourth Thursday of each month, opens at 6:30 AM; Clinic ends at 9 AM.  Patients are seen on a first-come first-served basis, and a limited number are seen during each clinic.   [facility]  [address] Hillard Danker [place], [place] [phone]   Eligibility Requirements You must have lived in [place], [place], or [place] counties for at least the last three months.   You cannot be eligible for state or federal sponsored [employer], including [employer], [place], or [employer].   You generally cannot be eligible for healthcare insurance through your employer.    How to apply: Eligibility screenings are held every Tuesday and Wednesday afternoon from 1:00 pm until 4:00 pm. You do not need an appointment for the interview!  [facility] [address] [place],  [place] Department  [place] Department  [place]  [phone]    Behavioral Health Resources in the Community: Intensive Outpatient Programs Organization         Address  Phone  Notes  [place]. [address] [place], [place]  [phone]   [facility] Outpatient [address], [place], [place]   [facility]: [facility] [address] [place], [place]   [place] [address]  [place], [place] or [phone]   Substance Abuse Resources Organization         Address  Phone  Notes  Alcohol and Drug Services  7745634564   [place]  [phone]   [facility] [place]   Chinita Pester  [phone]   Residential & Outpatient Substance Abuse Program  [phone]   Psychological Services Organization         Address  Phone  Notes  [facility] [place]  [place]  [phone]   [place] Choice [address] [place] or [phone]    Mobile Crisis Teams Organization         Address  Phone  Notes  [facility], Mobile Crisis Care Unit  [phone]   [facility]  [address] [place], [place]   [place] Levels [address], [place] [phone]    Self-Help/Support Groups Organization         Address  Phone             Notes  [place]. of [place] - variety of support groups  [place] Call for more information  Narcotics Anonymous (NA), [facility] [address], [employer] [place]  2 meetings at this location   Special educational needs teacher         Address  Phone  Notes  [facility]AP Residential Treatment [place],    [place]  [phone]   [facility]  [address] [place] [phone], [place], [place]   [place], [place] [phone] Admissions: 8am-3pm M-F  Incentives Substance [place] [address]    [place], [place] [phone]   [facility] [address] [place], [place], [place]   [facility] [address]    [place], [place]   [facility] - Intensive Outpatient [address] Krist[address], [place], [place]   [facility] ([place].,  [place], [place] [phone] or [phone]   [facility] (RTS) [address], [place], [place] Accepts Medicaid  [facility] [address],  [place] [phone] Substance Abuse/Addiction Treatment   [facility] Organization         Address  Phone  Notes  [facility]  [phone]   Domenic Schwab, PhD [address] Arlis [place], [place]   [phone] or [phone]   [place], [place] [phone]   [place] [address], [place], [place] [phone] Insurance/Medicaid/sponsorship through [employer] and Families [address], [id number]  [place], [place] [phone] [place], [place] [phone]    Dr. Adele Schilder  [phone]   [facility] Dept. 1) [address] [place] 2) [place] 3)  [address], [place] [phone]  [phone]   [place] [phone] or [phone] (After Hours)

## 2014-07-30 NOTE — ED Provider Notes (Signed)
CSN: [id number]     Arrival date & time 07/30/14  1032 History   First MD Initiated Contact with Patient 07/30/14 1048     Chief Complaint  Patient presents with  . Back Pain     (Consider location/radiation/quality/duration/timing/severity/associated sxs/prior Treatment) Patient is a 54 y.o. male presenting with back pain. The history is provided by the patient. No language interpreter was used.  Back Pain Associated symptoms: headaches   Associated symptoms: no abdominal pain, no chest pain, no dysuria, no fever, no numbness and no weakness   Adam Dillon is a 54 year old male with past medical history of multiple myeloma, renal disorder, hypertension presenting to the ED with back pain and headache since 07/22/2014. Patient was seen in the ED setting on 07/22/2014 regarding a syncopal episode resulting in a fall landing on the left side. Patient was seen and assessed in ED setting and discharged home with Percocets. 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 Stated that he's been experiencing left neck pain described as a constant throbbing sensation without radiation and left-sided neck pain described as a constant throbbing sensation. Reported that since this event he has been having intermittent headaches that occur at least once per day. Reported that the headache is localized to the left side described as a shooting, throbbing pain that progressively gets worse this time. Stated that the Percocets have been helping his pain and his headache, reported that he ran out and would like to get prescribed more. Patient denied head injury, loss of conscious, blurred vision, sudden loss of vision, difficulty swallowing, numbness, tingling, loss of sensation, urinary and bowel incontinence, nausea, vomiting, diarrhea, tinnitus, melena, hematochezia, fall, recent injury, hearing loss, fever, chills,  chest pain or shortness of breath, difficulty breathing, photophobia. Denied blood thinners.  PCP none  Past Medical History  Diagnosis Date  . Renal disorder   . Hypertension   . Cancer    History reviewed. No pertinent past surgical history. History reviewed. No pertinent family history. History  Substance Use Topics  . Smoking status: Never Smoker   . Smokeless tobacco: Not on file  . Alcohol Use: No    Review of Systems  Constitutional: Negative for fever and chills.  HENT: Negative for trouble swallowing.   Eyes: Negative for visual disturbance.  Respiratory: Negative for chest tightness and shortness of breath.   Cardiovascular: Negative for chest pain.  Gastrointestinal: Negative for vomiting, abdominal pain and diarrhea.  Genitourinary: Negative for dysuria and hematuria.  Musculoskeletal: Positive for myalgias, back pain and neck pain. Negative for neck stiffness.  Neurological: Positive for headaches. Negative for dizziness, weakness and numbness.      Allergies  Review of patient's allergies indicates no known allergies.  Home Medications   Prior to Admission medications   Medication Sig Start Date End Date Taking? Authorizing Provider  amLODipine (NORVASC) 10 MG tablet Take 10 mg by mouth daily. 07/12/14   Historical Provider, MD  amLODipine-olmesartan (AZOR) 10-40 MG per tablet Take 1 tablet by mouth daily.    Historical Provider, MD  bimatoprost (LUMIGAN) 0.03 % ophthalmic solution Place 1 drop into both eyes at bedtime.    Historical Provider, MD  COMBIGAN 0.2-0.5 % ophthalmic solution Place 1 drop into both eyes daily. 06/08/14   Historical Provider, MD  labetalol (NORMODYNE) 300 MG tablet Take 600 mg by mouth 2 (two) times  daily. 06/16/14   Historical Provider, MD  Multiple Vitamins-Minerals (MULTIVITAMIN WITH MINERALS) tablet Take 1 tablet by mouth daily.    Historical Provider, MD  oxyCODONE-acetaminophen (PERCOCET/ROXICET) 5-325 MG per tablet Take 2  tablets by mouth every 4 (four) hours as needed. 07/22/14   Tanna Furry, MD  sodium bicarbonate 650 MG tablet Take 650 mg by mouth 2 (two) times daily. 01/21/14   Historical Provider, MD  traMADol (ULTRAM) 50 MG tablet Take 1 tablet (50 mg total) by mouth every 12 (twelve) hours as needed for severe pain. 07/30/14   Daria Mcmeekin, PA-C   BP 149/79 mmHg  Pulse 73  Temp(Src) 98.3 F (36.8 C)  Resp 18  Ht 6' 1"  (1.854 m)  Wt 200 lb (90.719 kg)  BMI 26.39 kg/m2  SpO2 100% Physical Exam  Constitutional: He is oriented to person, place, and time. He appears well-developed and well-nourished. No distress.  HENT:  Head: Normocephalic and atraumatic.  Mouth/Throat: Oropharynx is clear and moist. No oropharyngeal exudate.  Eyes: Conjunctivae and EOM are normal. Pupils are equal, round, and reactive to light. Right eye exhibits no discharge. Left eye exhibits no discharge.  Neck: Normal range of motion. Neck supple. Muscular tenderness present. No tracheal deviation present.    Negative neck stiffness Negative nuchal rigidity Negative cervical lymphadenopathy Negative meningeal signs Negative trismus  Muscular tenderness upon palpation to the left side the neck. Negative abnormalities, deformities, signs of ecchymosis.  Cardiovascular: Normal rate, regular rhythm and normal heart sounds.  Exam reveals no friction rub.   No murmur heard. Pulses:      Radial pulses are 2+ on the right side, and 2+ on the left side.       Dorsalis pedis pulses are 2+ on the right side, and 2+ on the left side.  Cap refill less than 3 seconds Negative swelling or pitting edema identified to lower extremities bilaterally  AV fistula palpated in the right upper extremity  Pulmonary/Chest: Effort normal and breath sounds normal. No respiratory distress. He has no wheezes. He has no rales. He exhibits tenderness.    Patient is able to speak in full sentences without difficulty Negative use of accessory  muscles Negative stridor  Discomfort upon palpation to the mid axillary left rib with negative crepitus upon palpation. Negative signs of trauma or ecchymosis. Negative bulging or malalignments or deformities.  Abdominal: Soft. Bowel sounds are normal. He exhibits no distension. There is no tenderness. There is no rebound and no guarding.  Musculoskeletal: Normal range of motion. He exhibits tenderness. He exhibits no edema.  Negative deformities, swelling, ecchymosis or signs of trauma identified to the spine. Discomfort upon palpation to the midthoracic and mid lumbar spine.  Full ROM to upper and lower extremities without difficulty noted, negative ataxia noted.  Lymphadenopathy:    He has no cervical adenopathy.  Neurological: He is alert and oriented to person, place, and time. No cranial nerve deficit. He exhibits normal muscle tone. Coordination normal.  Cranial nerves III-XII grossly intact Strength 5+/5+ to upper and lower extremities bilaterally with resistance applied, equal distribution noted Sensation intact with differentiation sharp and dull touch Equal grip strength Negative saddle paresthesias bilaterally Patient is able to bring finger to nose bilaterally without difficulty or ataxia Negative facial droop Negative slurred speech Negative aphasia Negative arm drift Fine motor skills intact Heel to knee down shin normal bilaterally Patient follows commands well Patient responds to questions appropriately  Skin: Skin is warm and dry. No rash noted. He  is not diaphoretic. No erythema.  Psychiatric: He has a normal mood and affect. His behavior is normal. Thought content normal.  Nursing note and vitals reviewed.   ED Course  Procedures (including critical care time) Labs Review Labs Reviewed  URINALYSIS, ROUTINE W REFLEX MICROSCOPIC - Abnormal; Notable for the following:    Hgb urine dipstick SMALL (*)    Protein, ur >300 (*)    All other components within normal  limits  URINE RAPID DRUG SCREEN (HOSP PERFORMED) - Abnormal; Notable for the following:    Tetrahydrocannabinol POSITIVE (*)    All other components within normal limits  URINE MICROSCOPIC-ADD ON    Imaging Review Dg Ribs Unilateral W/chest Left  07/30/2014   CLINICAL DATA:  Back pain. Status post fall at work landing on the left side.  EXAM: LEFT RIBS AND CHEST - 3+ VIEW  COMPARISON:  None.  FINDINGS: No fracture or other bone lesions are seen involving the ribs. There is no evidence of pneumothorax or pleural effusion. Both lungs are clear. Heart size and mediastinal contours are within normal limits.  IMPRESSION: No acute osseous injury of the left ribs.   Electronically Signed   By: Kathreen Devoid   On: 07/30/2014 13:14   Dg Thoracic Spine 2 View  07/30/2014   CLINICAL DATA:  54 year old male tripped at work and fell 7 days ago. Pain and swelling. Dialysis patient. Initial encounter.  EXAM: THORACIC SPINE - 2 VIEW  COMPARISON:  Chest CT 07/22/2014  FINDINGS: Bone mineralization is within normal limits. Normal thoracic segmentation. Thoracic vertebral height and alignment appear stable and within normal limits. Mid thoracic degenerative endplate osteophytosis re- identified. Visualized posterior ribs appear intact. Grossly negative visualized thoracic visceral contours. Cervicothoracic junction alignment is within normal limits.  IMPRESSION: Negative.   Electronically Signed   By: Genevie Ann M.D.   On: 07/30/2014 13:15   Dg Lumbar Spine Complete  07/30/2014   CLINICAL DATA:  Tripped and fell 7 days ago at work, landing on the left side. Diffuse spine pain. Initial encounter.  EXAM: LUMBAR SPINE - COMPLETE 4+ VIEW  COMPARISON:  None.  FINDINGS: Corresponding thoracic radiographs demonstrate 12 pairs of ribs. There are 4 non rib-bearing lumbar type vertebral bodies. The next segment will be considered a sacralized L5. There is trace anterolisthesis of L3 on L4, likely facet mediated given evidence of  facet arthrosis at this level. Endplate osteophytosis and very mild disc space narrowing are also present at L3-4. Vertebral body heights are preserved without evidence of compression fracture. No pars defects are seen. No lytic or blastic osseous lesion.  IMPRESSION: 1. Transitional lumbosacral anatomy as above. 2. No acute osseous abnormality identified. 3. Mild disc and facet degeneration at L3-4 with trace anterolisthesis.   Electronically Signed   By: Logan Bores   On: 07/30/2014 13:17   Ct Head Wo Contrast  07/30/2014   CLINICAL DATA:  Headache and neck pain after fall  EXAM: CT HEAD WITHOUT CONTRAST  CT CERVICAL SPINE WITHOUT CONTRAST  TECHNIQUE: Multidetector CT imaging of the head and cervical spine was performed following the standard protocol without intravenous contrast. Multiplanar CT image reconstructions of the cervical spine were also generated.  COMPARISON:  None.  FINDINGS: CT HEAD FINDINGS  The ventricles are normal in size and configuration. There is no intracranial mass, hemorrhage, extra-axial fluid collection, or midline shift. Gray-white compartments are normal. No acute infarct apparent. The bony calvarium appears intact. The mastoid air cells are clear.  CT  CERVICAL SPINE FINDINGS  There is no fracture or spondylolisthesis. Prevertebral soft tissues and predental space regions are normal. There is moderate disc space narrowing at C4-5, C5-6, C6-7, and C7-T1. There is slightly milder narrowing at C3-4. There are anterior osteophytes to varying degrees at all levels. There is facet osteoarthritic change with exit foraminal narrowing at C3-4 on the left, C4-5 bilaterally, C5-6 bilaterally, and C6-7 bilaterally. There is no frank disc extrusion or high-grade stenosis.  IMPRESSION: CT head:  Study within normal limits.  CT cervical spine: No fracture or spondylolisthesis. Multilevel arthropathy.   Electronically Signed   By: Lowella Grip III M.D.   On: 07/30/2014 12:44   Ct Cervical  Spine Wo Contrast  07/30/2014   CLINICAL DATA:  Headache and neck pain after fall  EXAM: CT HEAD WITHOUT CONTRAST  CT CERVICAL SPINE WITHOUT CONTRAST  TECHNIQUE: Multidetector CT imaging of the head and cervical spine was performed following the standard protocol without intravenous contrast. Multiplanar CT image reconstructions of the cervical spine were also generated.  COMPARISON:  None.  FINDINGS: CT HEAD FINDINGS  The ventricles are normal in size and configuration. There is no intracranial mass, hemorrhage, extra-axial fluid collection, or midline shift. Gray-white compartments are normal. No acute infarct apparent. The bony calvarium appears intact. The mastoid air cells are clear.  CT CERVICAL SPINE FINDINGS  There is no fracture or spondylolisthesis. Prevertebral soft tissues and predental space regions are normal. There is moderate disc space narrowing at C4-5, C5-6, C6-7, and C7-T1. There is slightly milder narrowing at C3-4. There are anterior osteophytes to varying degrees at all levels. There is facet osteoarthritic change with exit foraminal narrowing at C3-4 on the left, C4-5 bilaterally, C5-6 bilaterally, and C6-7 bilaterally. There is no frank disc extrusion or high-grade stenosis.  IMPRESSION: CT head:  Study within normal limits.  CT cervical spine: No fracture or spondylolisthesis. Multilevel arthropathy.   Electronically Signed   By: Lowella Grip III M.D.   On: 07/30/2014 12:44   Dg Shoulder Left  07/30/2014   CLINICAL DATA:  54 year old male tripped at work and fell 7 days ago. Pain and swelling. Dialysis patient. Initial encounter.  EXAM: LEFT SHOULDER - 2+ VIEW  COMPARISON:  Chest radiographs today reported separately.  FINDINGS: No glenohumeral joint dislocation. Proximal left humerus intact. Left scapula and visible left clavicle appear intact. Visible left ribs and lung parenchyma within normal limits.  IMPRESSION: No acute fracture or dislocation identified about the left  shoulder.   Electronically Signed   By: Genevie Ann M.D.   On: 07/30/2014 13:13     EKG Interpretation None      MDM   Final diagnoses:  Fall  Muscle pain  Bilateral low back pain without sciatica    Medications - No data to display  Filed Vitals:   07/30/14 1345 07/30/14 1415 07/30/14 1436 07/30/14 1445  BP: 162/83 162/80 150/87 149/79  Pulse: 65 64 71 73  Temp:      Resp:   18   Height:      Weight:      SpO2: 100% 100% 98% 100%   Patient presenting to the ED with left sided rib pain and back pain since fall that occurred on 07/22/2014.  Left shoulder plain film no acute fracture or dislocation identified about the left shoulder. Lumbar plain film noted transitional lumbosacral anatomy-no acute osseous abnormalities identified with mild disc and face a degeneration at L3-4 with trace anterolisthesis. Thoracic plain film no acute  osseous injury identified. Plain film of unilateral chest no acute osseous injury of the left ribs. CT head without contrast unremarkable. CT cervical spine no fracture or spondylolisthesis. Urinalysis noted small hemoglobin-negative nitrites or leukocytes. Urine drug screen negative.  Imaging unremarkable for acute fracture or acute osseous abnormality. Negative signs of trauma. Negative focal neurological deficits. Strength intact with equal distribution. GCS 15. Patient follows commands well. Pulses palpable and strong. Cap refill less than 3 seconds. Negative signs of ischemia. Gait proper-negative step-offs or sway. Cannot rule out possible concussion secondary to event that occurred on 07/22/2014. Suspicion to be muscular pain secondary to pain upon palpation and pain with motion. Patient stable, afebrile. Patient not septic appearing. Negative signs of respiratory distress. Discharged patient. Discharge patient with tramadol. Discussed with patient to rest and stay hydrated. Discussed with patient to avoid any physical or strenuous activity. Discussed with  patient to [facility] and [facility]. Discussed with patient to apply heat and massage. Discussed with patient to closely monitor symptoms and if symptoms are to worsen or change to report back to the ED - strict return instructions given.  Patient agreed to plan of care, understood, all questions answered.   Jamse Mead, PA-C 07/30/14 [place], PA-C 07/30/14 [place], MD 07/30/14 1729  Varney Biles, MD 08/03/14 [id number]

## 2014-07-30 NOTE — ED Notes (Signed)
Pt here with continued back pain and headache after fall. sts pain meds have been working but he ran out.

## 2014-07-30 NOTE — ED Notes (Signed)
Pt ambulated well in hallway.

## 2014-07-30 NOTE — ED Notes (Signed)
Patient transported to [facility] 

## 2015-12-15 ENCOUNTER — Other Ambulatory Visit: Payer: Self-pay | Admitting: Internal Medicine

## 2015-12-15 DIAGNOSIS — M79605 Pain in left leg: Secondary | ICD-10-CM

## 2015-12-15 DIAGNOSIS — M7989 Other specified soft tissue disorders: Secondary | ICD-10-CM

## 2015-12-16 ENCOUNTER — Other Ambulatory Visit: Payer: Self-pay

## 2015-12-20 ENCOUNTER — Ambulatory Visit
Admission: RE | Admit: 2015-12-20 | Discharge: 2015-12-20 | Disposition: A | Discharge status: Home / Self Care | Payer: Medicare Other | Source: Ambulatory Visit | Attending: Internal Medicine | Admitting: Internal Medicine

## 2015-12-20 DIAGNOSIS — M79605 Pain in left leg: Secondary | ICD-10-CM

## 2015-12-20 DIAGNOSIS — M7989 Other specified soft tissue disorders: Secondary | ICD-10-CM

## 2016-06-16 IMAGING — CR DG CHEST 2V
2 series · 2 of 2 positions shown · non-contrast
Comparison: None

CLINICAL DATA: Syncope at work today, history renal disorder,
hypertension; EHR states "cancer" but this is not otherwise
specified.

EXAM:
CHEST  2 VIEW

[chest pa]
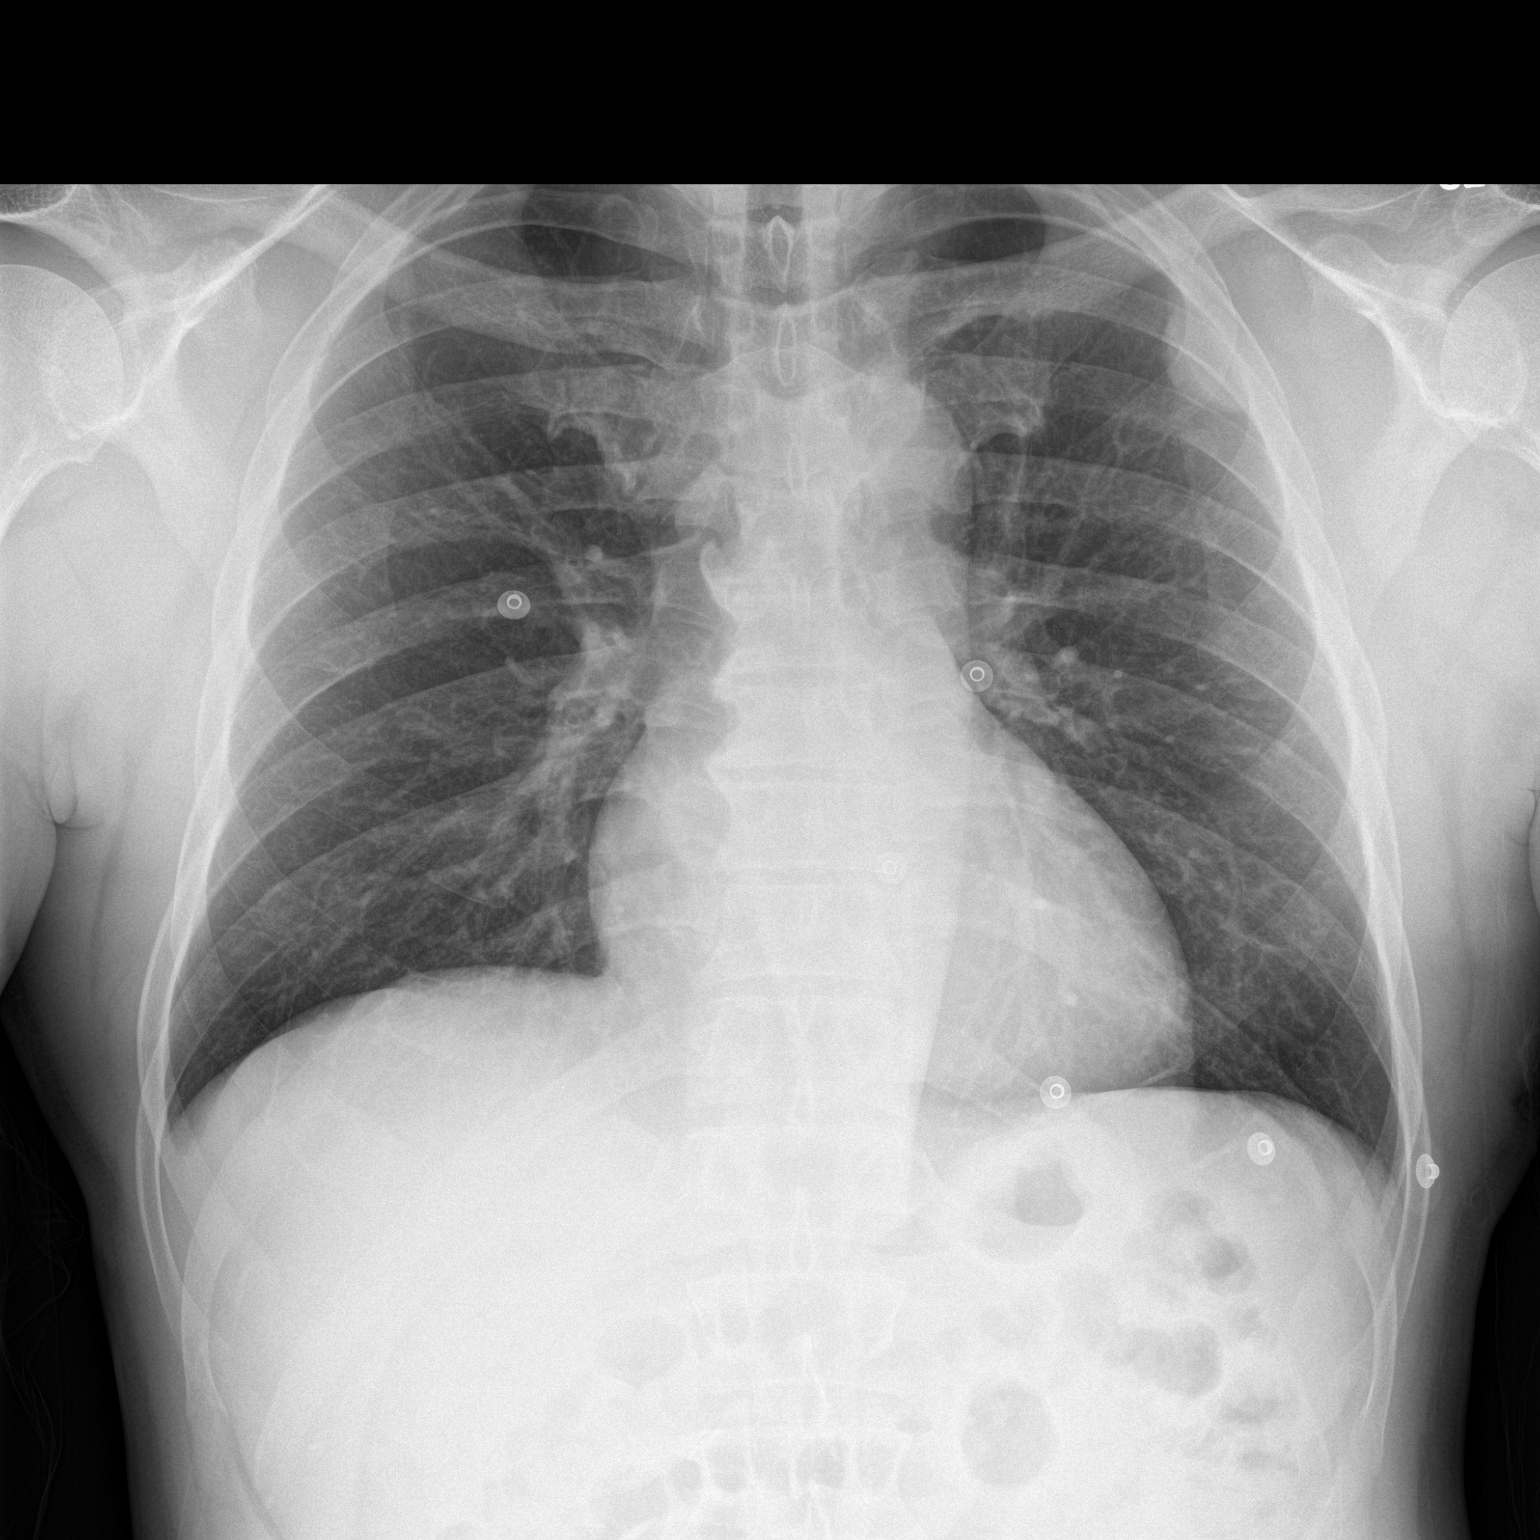

[chest lat]
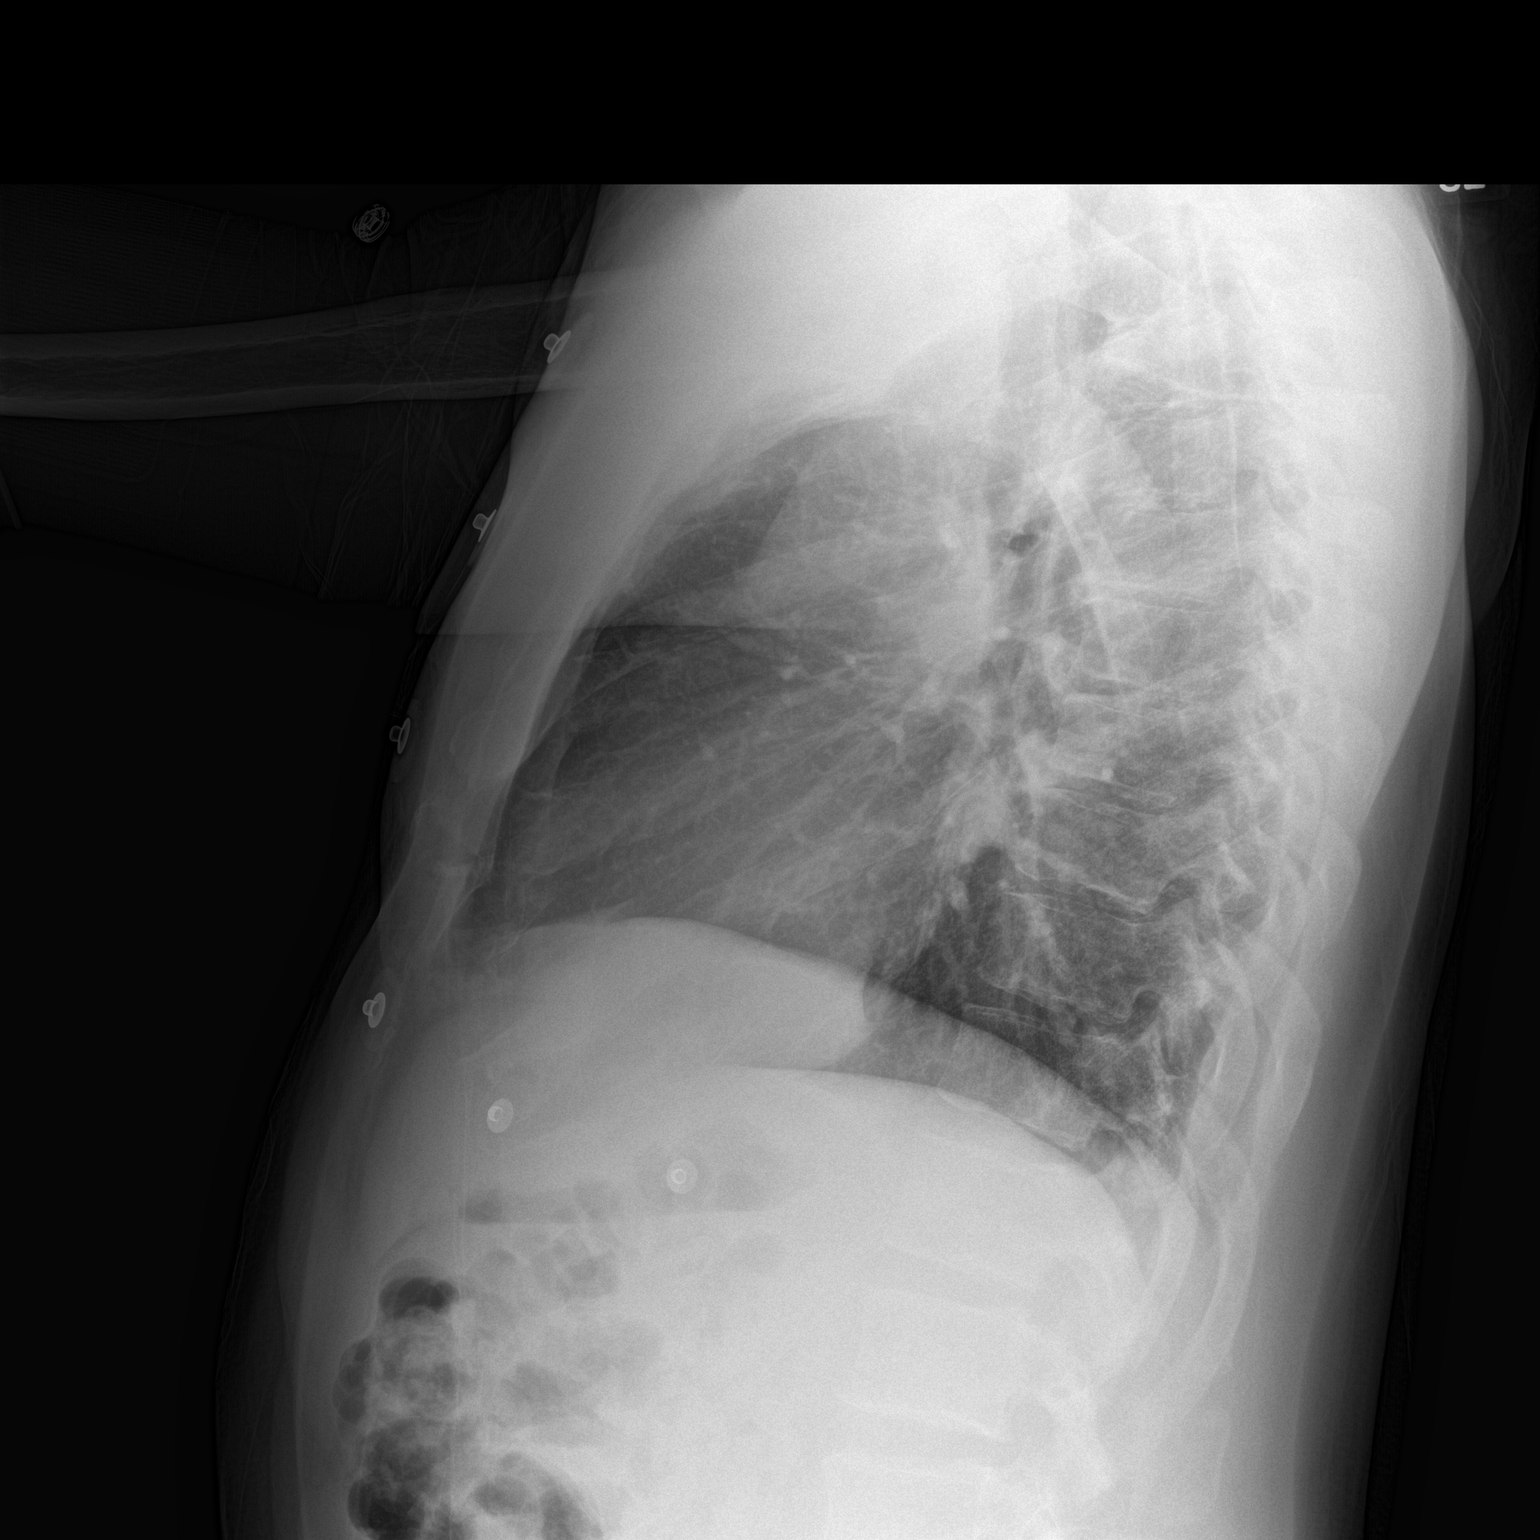

[2 of 2 positions shown; findings below may reference images not displayed]

FINDINGS: Normal heart size, mediastinal contours, and pulmonary vascularity.

Minimal peribronchial thickening.

No acute infiltrate, pleural effusion or pneumothorax.

Extrapleural density at lateral LEFT upper hemithorax, 3.2 x 1.8 cm,
uncertain etiology.

No definite bone destruction identified.

Scattered endplate spur formation thoracic spine.
IMPRESSION: Minimal bronchitic changes with an extrapleural appearing density at
the lateral LEFT apex 3.2 x 1.8 cm in size, of uncertain etiology ;
CT chest with contrast recommended to assess.

## 2016-06-16 IMAGING — CT CT CHEST W/O CM
2 of 3 series · 15 of 36 positions shown, 18 images · non-contrast
Comparison: Chest x-ray 07/22/2014

CLINICAL DATA: Post follow with back and neck pain. Abnormality
noted on recent chest x-ray suggesting left upper thoracic
pleural-based mass.

EXAM:
CT CHEST WITHOUT CONTRAST
TECHNIQUE: Multidetector CT imaging of the chest was performed following the
standard protocol without IV contrast..

[Series 2: thorax 5.0 i31f 1 · axial · 0.80mm/px · z∈[-278,-18]mm · 12 of 62 slices shown, 15 images]
[im 5/62  mediastinal]
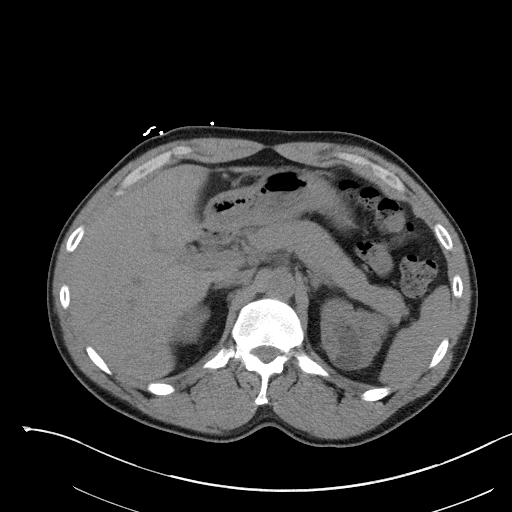
[im 5/62  lung]
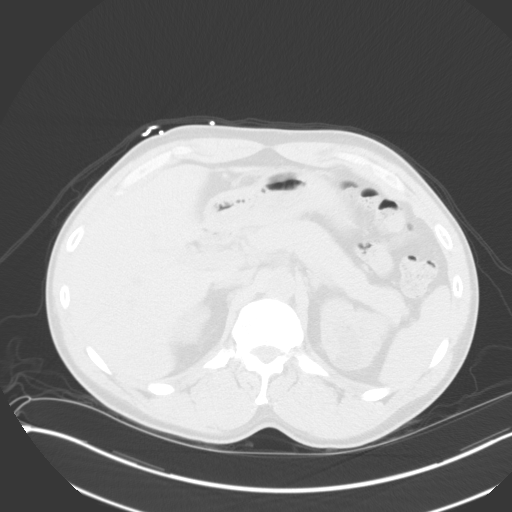
[im 10/62  lung]
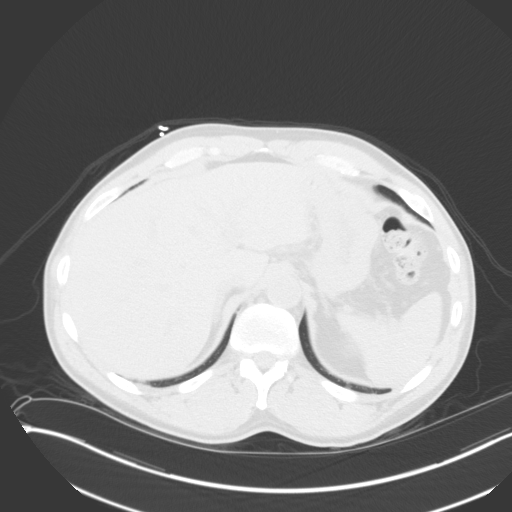
[im 14/62  lung]
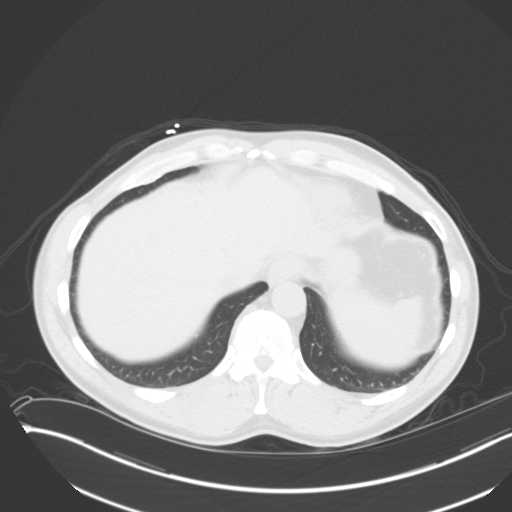
[im 19/62  lung]
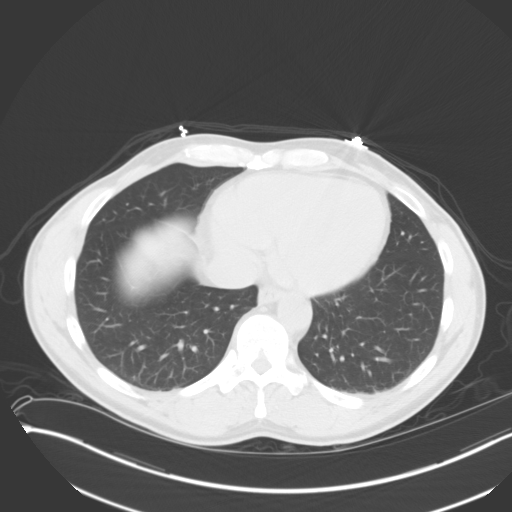
[im 23/62  mediastinal]
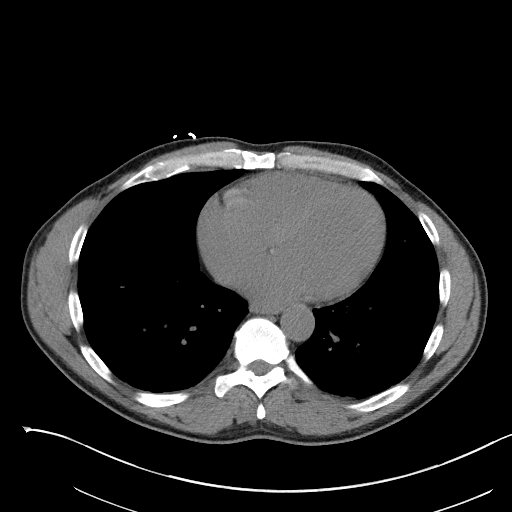
[im 23/62  lung]
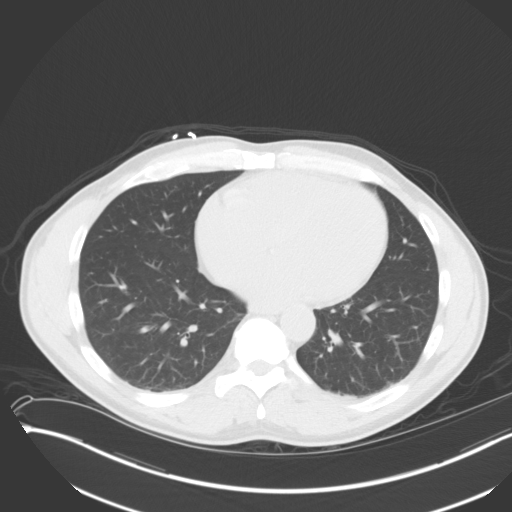
[im 28/62  lung]
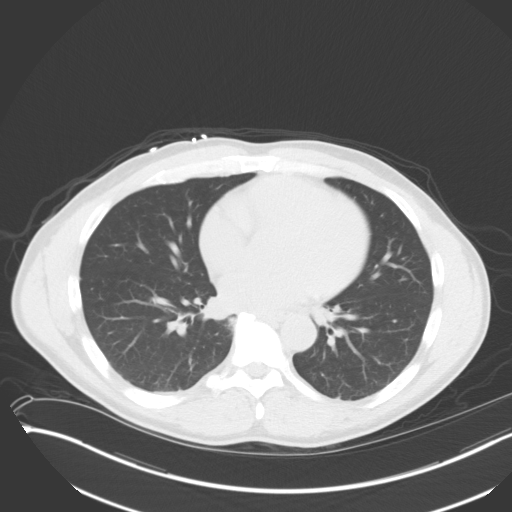
[im 34/62  lung]
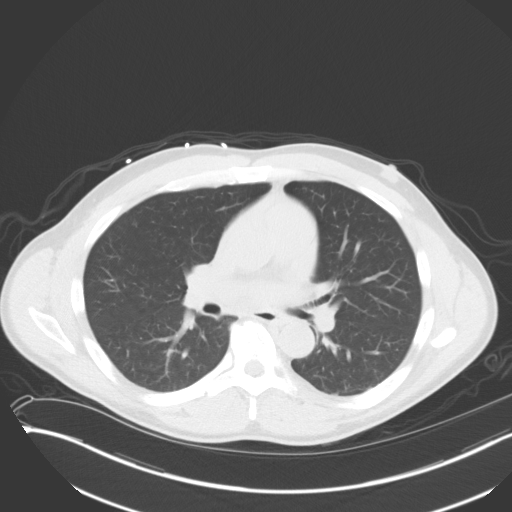
[im 39/62  lung]
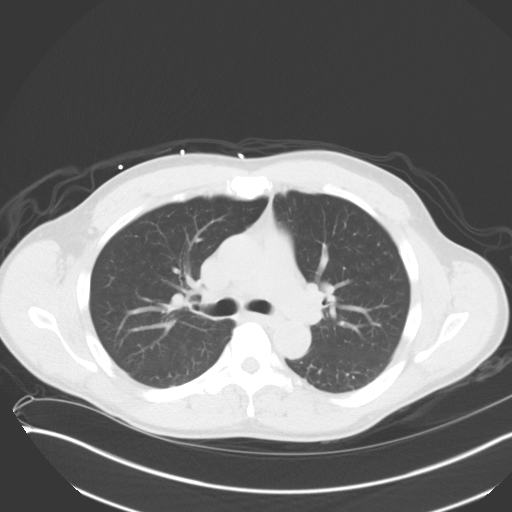
[im 43/62  mediastinal]
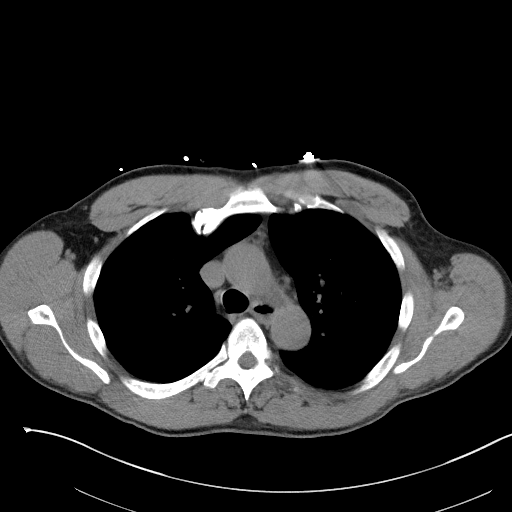
[im 43/62  lung]
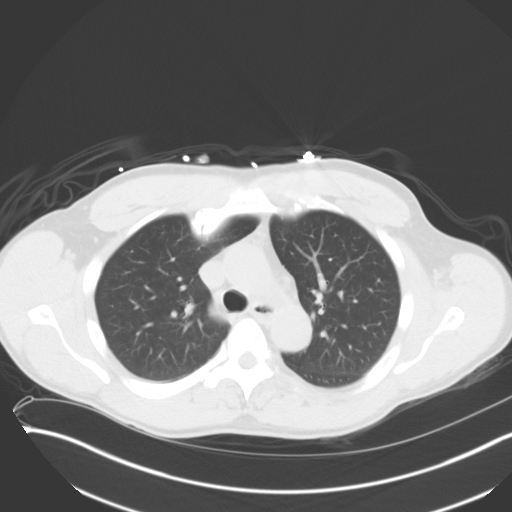
[im 48/62  lung]
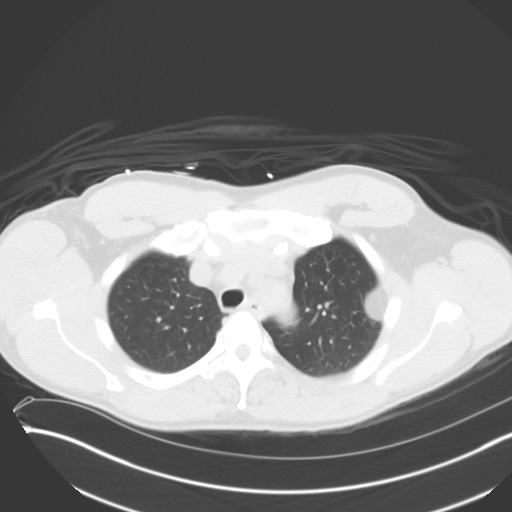
[im 52/62  lung]
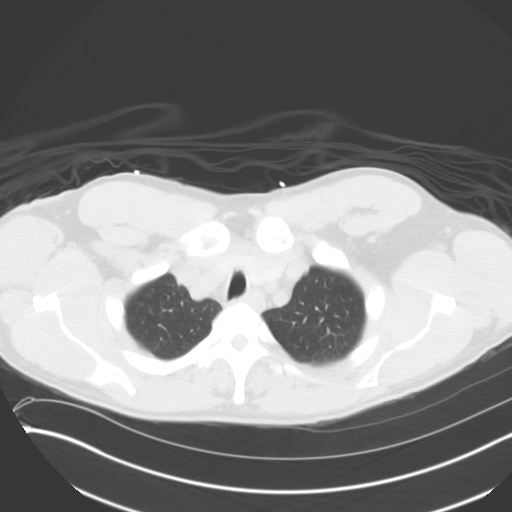
[im 57/62  lung]
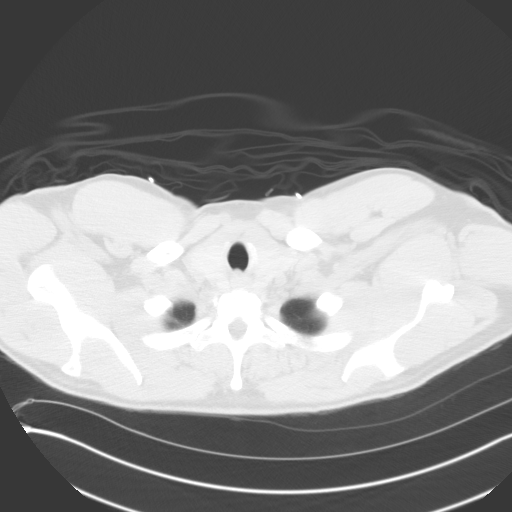

[Series 5: coronal · coronal · 0.62mm/px · 3 of 80 slices shown]
[im 16/80  lung]
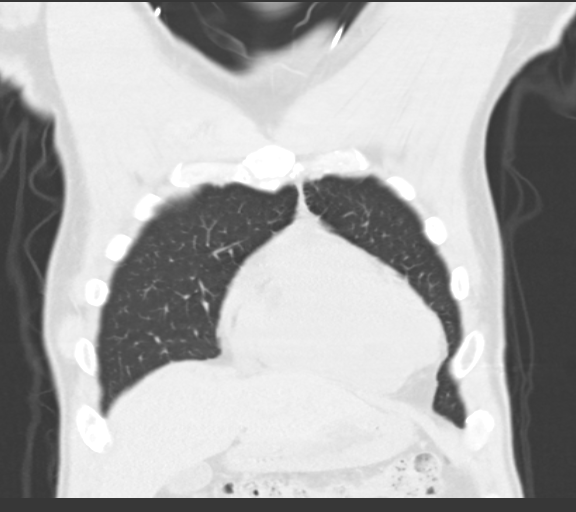
[im 32/80  lung]
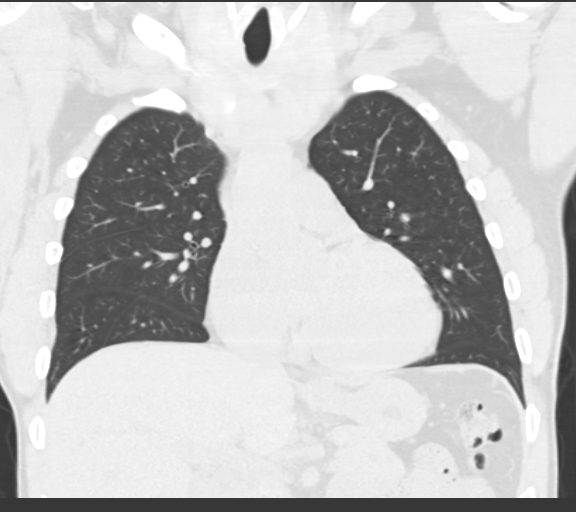
[im 48/80  lung]
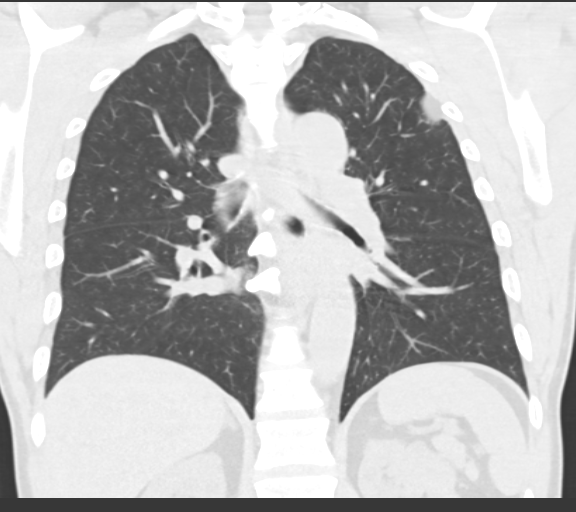

[15 of 36 positions shown; findings below may reference images not displayed]

FINDINGS: Lungs are adequately inflated without consolidation or effusion.
There is a smooth bordered pleural mass over the lateral left upper
thorax with homogeneous fat density measuring 2.1 x 3 cm in
transverse in AP dimension compatible a pleural lipoma. Airways are
normal.

Heart is normal in size. Remaining mediastinal structures are within
normal.

Images through the upper abdomen demonstrate multiple simple renal
cysts. There are mild degenerative changes of the spine.
IMPRESSION: No acute cardiopulmonary disease.

Pleural lipoma over the upper left lateral thorax measuring 2.1 x 3
cm.

Bilateral renal cysts.

## 2016-10-14 ENCOUNTER — Encounter (HOSPITAL_COMMUNITY): Payer: Self-pay | Admitting: Emergency Medicine

## 2016-10-14 ENCOUNTER — Emergency Department (HOSPITAL_COMMUNITY)
Admission: EM | Admit: 2016-10-14 | Discharge: 2016-10-14 | Disposition: A | Discharge status: Home / Self Care | Payer: Medicare Other | Attending: Emergency Medicine | Admitting: Emergency Medicine

## 2016-10-14 DIAGNOSIS — I10 Essential (primary) hypertension: Secondary | ICD-10-CM | POA: Insufficient documentation

## 2016-10-14 DIAGNOSIS — Z79899 Other long term (current) drug therapy: Secondary | ICD-10-CM | POA: Diagnosis not present

## 2016-10-14 DIAGNOSIS — T783XXA Angioneurotic edema, initial encounter: Secondary | ICD-10-CM | POA: Diagnosis not present

## 2016-10-14 DIAGNOSIS — R600 Localized edema: Secondary | ICD-10-CM | POA: Diagnosis present

## 2016-10-14 LAB — CBC WITH DIFFERENTIAL/PLATELET
Basophils Absolute: 0 10*3/uL (ref 0.0–0.1)
Basophils Relative: 0 %
EOS ABS: 0 10*3/uL (ref 0.0–0.7)
EOS PCT: 1 %
HCT: 35.8 % — ABNORMAL LOW (ref 39.0–52.0)
Hemoglobin: 12.1 g/dL — ABNORMAL LOW (ref 13.0–17.0)
Lymphocytes Relative: 9 %
Lymphs Abs: 0.7 10*3/uL (ref 0.7–4.0)
MCH: 32 pg (ref 26.0–34.0)
MCHC: 33.8 g/dL (ref 30.0–36.0)
MCV: 94.7 fL (ref 78.0–100.0)
MONOS PCT: 2 %
Monocytes Absolute: 0.2 10*3/uL (ref 0.1–1.0)
Neutro Abs: 7.4 10*3/uL (ref 1.7–7.7)
Neutrophils Relative %: 88 %
PLATELETS: 223 10*3/uL (ref 150–400)
RBC: 3.78 MIL/uL — ABNORMAL LOW (ref 4.22–5.81)
RDW: 13.5 % (ref 11.5–15.5)
WBC: 8.3 10*3/uL (ref 4.0–10.5)

## 2016-10-14 LAB — COMPREHENSIVE METABOLIC PANEL
ALT: 20 U/L (ref 17–63)
AST: 26 U/L (ref 15–41)
Albumin: 4.1 g/dL (ref 3.5–5.0)
Alkaline Phosphatase: 63 U/L (ref 38–126)
Anion gap: 9 (ref 5–15)
BUN: 32 mg/dL — ABNORMAL HIGH (ref 6–20)
CALCIUM: 9.4 mg/dL (ref 8.9–10.3)
CO2: 21 mmol/L — AB (ref 22–32)
Chloride: 103 mmol/L (ref 101–111)
Creatinine, Ser: 2.33 mg/dL — ABNORMAL HIGH (ref 0.61–1.24)
GFR calc non Af Amer: 29 mL/min — ABNORMAL LOW (ref >60)
GFR, EST AFRICAN AMERICAN: 34 mL/min — AB (ref >60)
Glucose, Bld: 123 mg/dL — ABNORMAL HIGH (ref 65–99)
Potassium: 4.5 mmol/L (ref 3.5–5.1)
SODIUM: 133 mmol/L — AB (ref 135–145)
Total Bilirubin: 0.6 mg/dL (ref 0.3–1.2)
Total Protein: 6.8 g/dL (ref 6.5–8.1)

## 2016-10-14 NOTE — Discharge Instructions (Signed)
As discussed, it is important that you follow up with your physician for continued management of your condition. ° °If you develop any new, or concerning changes in your condition, please return to the emergency department immediately. °

## 2016-10-14 NOTE — ED Notes (Signed)
Gave pt [place] sandwich and soda

## 2016-10-14 NOTE — ED Triage Notes (Signed)
Is a new kidney transplant as of 1 year ago .Adam Dillon Woke up with angioedema this am top lip swollen , handling his secretions well,  No drooling , airway patent 98 % ra, pt on lisinopril for almost a year

## 2016-10-14 NOTE — ED Provider Notes (Signed)
[facility] DEPT Provider Note   CSN: [id number] Arrival date & time: 10/14/16  1248     History   Chief Complaint Chief Complaint  Patient presents with  . Angioedema    HPI Adam Dillon is a 56 y.o. male.  The history is provided by the patient and medical records.  Allergic Reaction  Presenting symptoms: swelling   Presenting symptoms: no difficulty breathing, no difficulty swallowing, no itching, no rash and no wheezing   Severity:  Moderate Duration:  1 day Prior allergic episodes:  No prior episodes Context: medications (lisinopril)   Relieved by:  Nothing Worsened by:  Nothing Ineffective treatments:  None tried   Past Medical History:  Diagnosis Date  . Cancer ([place])   . Hypertension   . Renal disorder     There are no active problems to display for this patient.   History reviewed. No pertinent surgical history.     Home Medications    Prior to Admission medications   Medication Sig Start Date End Date Taking? Authorizing Provider  amLODipine (NORVASC) 10 MG tablet Take 10 mg by mouth daily. 07/12/14  Yes [provider]  amoxicillin (AMOXIL) 500 MG capsule Take 500 mg by mouth 3 (three) times daily. 10/11/16  Yes [provider]  aspirin EC 81 MG tablet Take 81 mg by mouth daily. 06/06/15  Yes [provider]  bimatoprost (LUMIGAN) 0.03 % ophthalmic solution Place 1 drop into both eyes at bedtime.   Yes [provider]  COMBIGAN 0.2-0.5 % ophthalmic solution Place 1 drop into both eyes daily. 06/08/14  Yes [provider]  HYDROcodone-acetaminophen (NORCO/VICODIN) 5-325 MG tablet Take 1 tablet by mouth daily as needed for pain. 10/11/16  Yes [provider]  labetalol (NORMODYNE) 300 MG tablet Take 600 mg by mouth 2 (two) times daily. 06/16/14  Yes [provider]  lisinopril (PRINIVIL,ZESTRIL) 2.5 MG tablet Take 2.5 mg by mouth daily. 08/02/16 01/29/17 Yes [provider]  magnesium  oxide (MAG-OX) 400 MG tablet Take 400 mg by mouth 2 (two) times daily. 07/30/16  Yes [provider]  Multiple Vitamins-Minerals (MULTIVITAMIN WITH MINERALS) tablet Take 1 tablet by mouth daily.   Yes [provider]  mycophenolate (MYFORTIC) 180 MG EC tablet Take 720 mg by mouth 2 (two) times daily. 08/08/16  Yes [provider]  predniSONE (DELTASONE) 5 MG tablet Take 5 mg by mouth daily. 01/06/16 01/05/17 Yes [provider]  ranitidine (ZANTAC) 150 MG tablet Take 150 mg by mouth at bedtime. 06/25/16  Yes [provider]  sulfamethoxazole-trimethoprim (BACTRIM,SEPTRA) 400-80 MG tablet Take 1 tablet by mouth 3 (three) times a week. Mon / Wed / Fri 06/21/16  Yes [provider]  Tacrolimus 1 MG TB24 Take 10 mg by mouth daily. 08/08/16  Yes [provider]    Family History No family history on file.  Social History Social History  Substance Use Topics  . Smoking status: Never Smoker  . Smokeless tobacco: Never Used  . Alcohol use No     Allergies   Patient has no known allergies.   Review of Systems Review of Systems  Constitutional: Negative for chills, diaphoresis, fatigue and fever.  HENT: Positive for facial swelling. Negative for congestion, hearing loss and trouble swallowing.   Eyes: Negative for visual disturbance.  Respiratory: Negative for cough, chest tightness, shortness of breath and wheezing.   Cardiovascular: Negative for chest pain.  Gastrointestinal: Negative for abdominal pain, constipation, diarrhea, nausea and vomiting.  Genitourinary: Negative for flank pain.  Musculoskeletal: Negative for back pain, neck pain and neck stiffness.  Skin: Negative for itching, rash and wound.  Neurological: Negative for weakness, light-headedness and headaches.  Psychiatric/Behavioral: Negative for agitation.  All other systems reviewed and are negative.    Physical Exam Updated Vital Signs BP (!) 143/90 (BP  Location: Right Arm)   Pulse 75   Temp 98.2 F (36.8 C) (Oral)   Resp 20   SpO2 100%   Physical Exam  Constitutional: He appears well-developed and well-nourished. No distress.  HENT:  Head: Atraumatic.    Right Ear: External ear normal.  Left Ear: External ear normal.  Nose: Nose normal.  Mouth/Throat: Uvula is midline. He does not have dentures. No oral lesions. No trismus in the jaw. Normal dentition. No uvula swelling, lacerations or dental caries. No oropharyngeal exudate, posterior oropharyngeal edema, posterior oropharyngeal erythema or tonsillar abscesses.  Eyes: Conjunctivae and EOM are normal.  Neck: Normal range of motion. Neck supple.  Cardiovascular: Normal rate and regular rhythm.   No murmur heard. Pulmonary/Chest: Effort normal and breath sounds normal. No stridor. No respiratory distress. He has no wheezes. He exhibits no tenderness.  Abdominal: Soft. There is no tenderness.  Musculoskeletal: He exhibits no edema.  Neurological: He is alert. No sensory deficit. He exhibits normal muscle tone.  Skin: Skin is warm and dry. He is not diaphoretic. No erythema. No pallor.  Psychiatric: He has a normal mood and affect.  Nursing note and vitals reviewed.    ED Treatments / Results  Labs (all labs ordered are listed, but only abnormal results are displayed) Labs Reviewed  CBC WITH DIFFERENTIAL/PLATELET - Abnormal; Notable for the following:       Result Value   RBC 3.78 (*)    Hemoglobin 12.1 (*)    HCT 35.8 (*)    All other components within normal limits  COMPREHENSIVE METABOLIC PANEL - Abnormal; Notable for the following:    Sodium 133 (*)    CO2 21 (*)    Glucose, Bld 123 (*)    BUN 32 (*)    Creatinine, Ser 2.33 (*)    GFR calc non Af Amer 29 (*)    GFR calc Af Amer 34 (*)    All other components within normal limits    EKG  EKG Interpretation None       Radiology No results found.  Procedures Procedures (including critical care  time)  Medications Ordered in ED Medications - No data to display   Initial Impression / Assessment and Plan / ED Course  I have reviewed the triage vital signs and the nursing notes.  Pertinent labs & imaging results that were available during my care of the patient were reviewed by me and considered in my medical decision making (see chart for details).     Adam Dillon is a 56 y.o. male with a past medical history significant for hypertension, renal transplant currently on immunosuppression who presents with lip swelling. Patient reports that he has no history of angioedema or lip swelling. He is currently on lisinopril. Patient says that he was in normal health yesterday until waking up this morning with a swollen upper lip. He says it has continued to swell throughout the morning propping and to seek evaluation. He denies any fevers, chills, chest pain, shortness of breath, difficult swallowing, difficulty breathing, which change. He denies any wheezing, chest tightness, nausea, vomiting, conservation, diarrhea, or dysuria. He denies any rashes. He denies  any new medication changes. He denies any allergic reactions to other medications foods or insect bites. He denies any other complains.  On exam, patient's upper lip is markedly swollen. Lower lip is slightly swollen. Suspect angioedema based on appearance.  Patient had no stridor on exam. Lungs were clear with no wheezing. Abdomen was nontender. No focal neurologic deficits.  Suspect lisinopril as the cause of angioedema and lip swelling. Given patient's gradual worsening until presentation, patient will need to be observed for several hours to ensure no worsening or any airway compromise. Patient continues to deny any shortness of breath or difficulty breathing.  Patient had screening laboratory testing shows improved creatinine from prior. No significant electrolyte changes. Mild anemia with no leukocytosis.  Sincerely transferred to  Dr. Vanita Panda while awaiting reassessment for worsening angioedema. Patient will be instructed to stop lisinopril and follow up with his transplant team to discuss further transfer medications. And despite discharge after period of observation.     Final Clinical Impressions(s) / ED Diagnoses   Final diagnoses:  Angioedema, initial encounter    New Prescriptions New Prescriptions   No medications on file   Clinical Impression: 1. Angioedema, initial encounter     Disposition: Care transferred to Dr. Vanita Panda  Condition: Stable    Charleigh Correnti, Gwenyth Allegra, MD 10/15/16 1038

## 2016-10-14 NOTE — ED Provider Notes (Signed)
Patient awake and alert, no evidence for posterior oral pharyngeal swelling, no distress, no hypoxia, tachypnea, and he states that he feels generally better. We discussed the importance of following up with his transplant team, not taking lisinopril, and the patient was discharged in stable condition.   Carmin Muskrat, MD 10/14/16 [phone]

## 2019-06-05 ENCOUNTER — Other Ambulatory Visit: Payer: Self-pay | Admitting: Physical Medicine and Rehabilitation

## 2019-06-05 DIAGNOSIS — M542 Cervicalgia: Secondary | ICD-10-CM

## 2019-06-15 ENCOUNTER — Other Ambulatory Visit: Payer: Self-pay

## 2019-06-15 ENCOUNTER — Ambulatory Visit
Admission: RE | Admit: 2019-06-15 | Discharge: 2019-06-15 | Disposition: A | Discharge status: Home / Self Care | Payer: Medicare Other | Source: Ambulatory Visit | Attending: Physical Medicine and Rehabilitation | Admitting: Physical Medicine and Rehabilitation

## 2019-06-15 DIAGNOSIS — M542 Cervicalgia: Secondary | ICD-10-CM

## 2019-06-29 ENCOUNTER — Other Ambulatory Visit: Payer: Medicare Other

## 2019-09-15 ENCOUNTER — Encounter (HOSPITAL_COMMUNITY): Payer: Self-pay | Admitting: Internal Medicine

## 2019-09-15 ENCOUNTER — Emergency Department (HOSPITAL_COMMUNITY): Payer: Medicare Other

## 2019-09-15 ENCOUNTER — Other Ambulatory Visit: Payer: Self-pay

## 2019-09-15 ENCOUNTER — Inpatient Hospital Stay (HOSPITAL_COMMUNITY)
Admission: EM | Admit: 2019-09-15 | Discharge: 2019-09-17 | DRG: 699 | Disposition: A | Discharge status: Home / Self Care | Payer: Medicare Other | Attending: Internal Medicine | Admitting: Internal Medicine

## 2019-09-15 DIAGNOSIS — R112 Nausea with vomiting, unspecified: Secondary | ICD-10-CM | POA: Diagnosis not present

## 2019-09-15 DIAGNOSIS — R197 Diarrhea, unspecified: Secondary | ICD-10-CM

## 2019-09-15 DIAGNOSIS — E872 Acidosis, unspecified: Secondary | ICD-10-CM

## 2019-09-15 DIAGNOSIS — E860 Dehydration: Secondary | ICD-10-CM | POA: Diagnosis present

## 2019-09-15 DIAGNOSIS — A084 Viral intestinal infection, unspecified: Secondary | ICD-10-CM | POA: Diagnosis present

## 2019-09-15 DIAGNOSIS — F1290 Cannabis use, unspecified, uncomplicated: Secondary | ICD-10-CM | POA: Diagnosis present

## 2019-09-15 DIAGNOSIS — Y830 Surgical operation with transplant of whole organ as the cause of abnormal reaction of the patient, or of later complication, without mention of misadventure at the time of the procedure: Secondary | ICD-10-CM | POA: Diagnosis present

## 2019-09-15 DIAGNOSIS — I129 Hypertensive chronic kidney disease with stage 1 through stage 4 chronic kidney disease, or unspecified chronic kidney disease: Secondary | ICD-10-CM | POA: Diagnosis present

## 2019-09-15 DIAGNOSIS — R059 Cough, unspecified: Secondary | ICD-10-CM

## 2019-09-15 DIAGNOSIS — R109 Unspecified abdominal pain: Secondary | ICD-10-CM

## 2019-09-15 DIAGNOSIS — R1110 Vomiting, unspecified: Secondary | ICD-10-CM | POA: Diagnosis not present

## 2019-09-15 DIAGNOSIS — B1920 Unspecified viral hepatitis C without hepatic coma: Secondary | ICD-10-CM | POA: Diagnosis present

## 2019-09-15 DIAGNOSIS — R1031 Right lower quadrant pain: Secondary | ICD-10-CM

## 2019-09-15 DIAGNOSIS — T8619 Other complication of kidney transplant: Principal | ICD-10-CM | POA: Diagnosis present

## 2019-09-15 DIAGNOSIS — R1114 Bilious vomiting: Secondary | ICD-10-CM

## 2019-09-15 DIAGNOSIS — N179 Acute kidney failure, unspecified: Secondary | ICD-10-CM

## 2019-09-15 DIAGNOSIS — Z940 Kidney transplant status: Secondary | ICD-10-CM

## 2019-09-15 DIAGNOSIS — R05 Cough: Secondary | ICD-10-CM | POA: Diagnosis present

## 2019-09-15 DIAGNOSIS — Z20822 Contact with and (suspected) exposure to covid-19: Secondary | ICD-10-CM | POA: Diagnosis present

## 2019-09-15 DIAGNOSIS — Z79899 Other long term (current) drug therapy: Secondary | ICD-10-CM

## 2019-09-15 DIAGNOSIS — Z7982 Long term (current) use of aspirin: Secondary | ICD-10-CM

## 2019-09-15 DIAGNOSIS — D849 Immunodeficiency, unspecified: Secondary | ICD-10-CM | POA: Diagnosis present

## 2019-09-15 DIAGNOSIS — N184 Chronic kidney disease, stage 4 (severe): Secondary | ICD-10-CM | POA: Diagnosis present

## 2019-09-15 LAB — URINALYSIS, ROUTINE W REFLEX MICROSCOPIC
Bacteria, UA: NONE SEEN
Bilirubin Urine: NEGATIVE
Glucose, UA: NEGATIVE mg/dL
Ketones, ur: NEGATIVE mg/dL
Leukocytes,Ua: NEGATIVE
Nitrite: NEGATIVE
Protein, ur: >=300 mg/dL — AB
Specific Gravity, Urine: 1.009 (ref 1.005–1.030)
pH: 5 (ref 5.0–8.0)

## 2019-09-15 LAB — COMPREHENSIVE METABOLIC PANEL
ALT: 17 U/L (ref 0–44)
AST: 26 U/L (ref 15–41)
Albumin: 3.5 g/dL (ref 3.5–5.0)
Alkaline Phosphatase: 63 U/L (ref 38–126)
Anion gap: 11 (ref 5–15)
BUN: 30 mg/dL — ABNORMAL HIGH (ref 6–20)
CO2: 17 mmol/L — ABNORMAL LOW (ref 22–32)
Calcium: 8.7 mg/dL — ABNORMAL LOW (ref 8.9–10.3)
Chloride: 107 mmol/L (ref 98–111)
Creatinine, Ser: 2.76 mg/dL — ABNORMAL HIGH (ref 0.61–1.24)
GFR calc Af Amer: 28 mL/min — ABNORMAL LOW (ref >60)
GFR calc non Af Amer: 24 mL/min — ABNORMAL LOW (ref >60)
Glucose, Bld: 112 mg/dL — ABNORMAL HIGH (ref 70–99)
Potassium: 4.6 mmol/L (ref 3.5–5.1)
Sodium: 135 mmol/L (ref 135–145)
Total Bilirubin: 0.4 mg/dL (ref 0.3–1.2)
Total Protein: 6.5 g/dL (ref 6.5–8.1)

## 2019-09-15 LAB — CBC WITH DIFFERENTIAL/PLATELET
Abs Immature Granulocytes: 0.01 10*3/uL (ref 0.00–0.07)
Basophils Absolute: 0 10*3/uL (ref 0.0–0.1)
Basophils Relative: 0 %
Eosinophils Absolute: 0 10*3/uL (ref 0.0–0.5)
Eosinophils Relative: 0 %
HCT: 39 % (ref 39.0–52.0)
Hemoglobin: 13.5 g/dL (ref 13.0–17.0)
Immature Granulocytes: 0 %
Lymphocytes Relative: 6 %
Lymphs Abs: 0.3 10*3/uL — ABNORMAL LOW (ref 0.7–4.0)
MCH: 32.7 pg (ref 26.0–34.0)
MCHC: 34.6 g/dL (ref 30.0–36.0)
MCV: 94.4 fL (ref 80.0–100.0)
Monocytes Absolute: 0.6 10*3/uL (ref 0.1–1.0)
Monocytes Relative: 12 %
Neutro Abs: 4.3 10*3/uL (ref 1.7–7.7)
Neutrophils Relative %: 82 %
Platelets: 228 10*3/uL (ref 150–400)
RBC: 4.13 MIL/uL — ABNORMAL LOW (ref 4.22–5.81)
RDW: 13.4 % (ref 11.5–15.5)
WBC: 5.3 10*3/uL (ref 4.0–10.5)
nRBC: 0.4 % — ABNORMAL HIGH (ref 0.0–0.2)

## 2019-09-15 LAB — SARS CORONAVIRUS 2 BY RT PCR (HOSPITAL ORDER, PERFORMED IN ~~LOC~~ HOSPITAL LAB): SARS Coronavirus 2: NEGATIVE

## 2019-09-15 LAB — LIPASE, BLOOD: Lipase: 35 U/L (ref 11–51)

## 2019-09-15 MED ORDER — HEPARIN SODIUM (PORCINE) 5000 UNIT/ML IJ SOLN
5000.0000 [IU] | Freq: Three times a day (TID) | INTRAMUSCULAR | Status: DC
  Administered 2019-09-16 – 2019-09-17 (×4): 5000 [IU] via SUBCUTANEOUS
  Filled 2019-09-15 (×5): qty 1

## 2019-09-15 MED ORDER — LABETALOL HCL 200 MG PO TABS
200.0000 mg | ORAL_TABLET | Freq: Two times a day (BID) | ORAL | Status: DC
  Administered 2019-09-16 – 2019-09-17 (×3): 200 mg via ORAL
  Filled 2019-09-15 (×3): qty 1

## 2019-09-15 MED ORDER — ONDANSETRON HCL 4 MG/2ML IJ SOLN
4.0000 mg | Freq: Four times a day (QID) | INTRAMUSCULAR | Status: DC | PRN
  Administered 2019-09-16 – 2019-09-17 (×2): 4 mg via INTRAVENOUS
  Filled 2019-09-15 (×2): qty 2

## 2019-09-15 MED ORDER — LACTATED RINGERS IV SOLN: INTRAVENOUS | Status: DC

## 2019-09-15 MED ORDER — FENTANYL CITRATE (PF) 100 MCG/2ML IJ SOLN
25.0000 ug | Freq: Once | INTRAMUSCULAR | Status: AC
  Administered 2019-09-15: 25 ug via INTRAVENOUS
  Filled 2019-09-15: qty 2

## 2019-09-15 MED ORDER — ASPIRIN EC 81 MG PO TBEC
81.0000 mg | DELAYED_RELEASE_TABLET | Freq: Every day | ORAL | Status: DC
  Administered 2019-09-16 – 2019-09-17 (×2): 81 mg via ORAL
  Filled 2019-09-15 (×2): qty 1

## 2019-09-15 MED ORDER — FENTANYL CITRATE (PF) 100 MCG/2ML IJ SOLN
50.0000 ug | Freq: Once | INTRAMUSCULAR | Status: AC
  Administered 2019-09-15: 50 ug via INTRAVENOUS
  Filled 2019-09-15: qty 2

## 2019-09-15 MED ORDER — SODIUM CHLORIDE 0.9 % IV SOLN: INTRAVENOUS | Status: DC

## 2019-09-15 MED ORDER — ONDANSETRON HCL 4 MG/2ML IJ SOLN: 4.0000 mg | Freq: Once | INTRAMUSCULAR | Status: DC

## 2019-09-15 MED ORDER — DM-GUAIFENESIN ER 30-600 MG PO TB12
1.0000 | ORAL_TABLET | Freq: Two times a day (BID) | ORAL | Status: DC
  Administered 2019-09-16 – 2019-09-17 (×4): 1 via ORAL
  Filled 2019-09-15 (×4): qty 1

## 2019-09-15 MED ORDER — ONDANSETRON HCL 4 MG/2ML IJ SOLN
4.0000 mg | Freq: Once | INTRAMUSCULAR | Status: AC
  Administered 2019-09-15: 4 mg via INTRAVENOUS
  Filled 2019-09-15: qty 2

## 2019-09-15 MED ORDER — MYCOPHENOLATE SODIUM 180 MG PO TBEC
720.0000 mg | DELAYED_RELEASE_TABLET | Freq: Two times a day (BID) | ORAL | Status: DC
  Administered 2019-09-16 – 2019-09-17 (×3): 720 mg via ORAL
  Filled 2019-09-15 (×3): qty 4

## 2019-09-15 MED ORDER — MAGNESIUM OXIDE 400 (241.3 MG) MG PO TABS
400.0000 mg | ORAL_TABLET | Freq: Two times a day (BID) | ORAL | Status: DC
  Administered 2019-09-16 – 2019-09-17 (×4): 400 mg via ORAL
  Filled 2019-09-15 (×4): qty 1

## 2019-09-15 MED ORDER — MORPHINE SULFATE (PF) 2 MG/ML IV SOLN: 1.0000 mg | INTRAVENOUS | Status: DC | PRN

## 2019-09-15 MED ORDER — TACROLIMUS ER 4 MG PO TB24
8.0000 mg | ORAL_TABLET | Freq: Every day | ORAL | Status: DC
  Filled 2019-09-15: qty 1

## 2019-09-15 MED ORDER — AMLODIPINE BESYLATE 10 MG PO TABS
10.0000 mg | ORAL_TABLET | Freq: Every day | ORAL | Status: DC
  Administered 2019-09-16 – 2019-09-17 (×2): 10 mg via ORAL
  Filled 2019-09-15 (×2): qty 1

## 2019-09-15 MED ORDER — PREDNISONE 5 MG PO TABS
5.0000 mg | ORAL_TABLET | Freq: Every day | ORAL | Status: DC
  Administered 2019-09-16 – 2019-09-17 (×2): 5 mg via ORAL
  Filled 2019-09-15 (×2): qty 1

## 2019-09-15 MED ORDER — SODIUM CHLORIDE 0.9 % IV BOLUS
1000.0000 mL | Freq: Once | INTRAVENOUS | Status: AC
  Administered 2019-09-15: 1000 mL via INTRAVENOUS

## 2019-09-15 MED ORDER — METOCLOPRAMIDE HCL 5 MG/ML IJ SOLN
10.0000 mg | Freq: Once | INTRAMUSCULAR | Status: AC
  Administered 2019-09-15: 10 mg via INTRAVENOUS
  Filled 2019-09-15: qty 2

## 2019-09-15 MED ORDER — ACETAMINOPHEN 650 MG RE SUPP: 650.0000 mg | Freq: Four times a day (QID) | RECTAL | Status: DC | PRN

## 2019-09-15 MED ORDER — ACETAMINOPHEN 325 MG PO TABS: 650.0000 mg | ORAL_TABLET | Freq: Four times a day (QID) | ORAL | Status: DC | PRN

## 2019-09-15 NOTE — ED Provider Notes (Signed)
Patient care assumed at 1600. Patient with history of renal transplant here for evaluation of abdominal pain, nausea, vomiting. CT scan pending.  CT scan without acute abnormalities, UA not consistent with UTI. Patient with persistent nausea, vomiting and malaise during his ED stay despite multiple antiemetics. Given ongoing symptoms will admit for further treatment. Patient is in agreement with treatment plan.   Quintella Reichert, MD 09/15/19 2137

## 2019-09-15 NOTE — ED Triage Notes (Signed)
Patient presents to the ED from home via [facility] with C/O abdominal pain. Patient has a history of a kidney transplant 4 years ago.  Patient C/O weakness, tremors and abdominal pain that began Thursday. Patient became progressively weaker and began vomiting after PO intake.  C/O productive cough, generalized abdominal pain, and a strong urine odor at present.

## 2019-09-15 NOTE — ED Notes (Signed)
Pt given [place] sandwich and ginger ale. Pt ate small bites and drank the ginger ale to "not upset his stomach".

## 2019-09-15 NOTE — ED Notes (Signed)
Help get patient undress into a gown on the monitor patient is resting with call bell in reach and family at bedside got patient a warm blanket

## 2019-09-15 NOTE — ED Notes (Signed)
Pt given water, tolerated well. Pt also aware a urine sample is needed.

## 2019-09-15 NOTE — H&P (Signed)
History and Physical    Adam Dillon XTK:[id number] DOB: 22-Feb-1961 DOA: 09/15/2019  PCP: System, Pcp Not In Patient coming from: Home  Chief Complaint: Abdominal pain, nausea, vomiting  HPI: Adam Dillon is a 59 y.o. 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  The pain is in the right lower quadrant of his abdomen where he has this transplanted kidney.  He has also had watery diarrhea.  He has been vaccinated for Covid.  Reports having a productive cough.  Denies shortness of breath or chest pain.  Reports using marijuana on a regular basis.  Patient states he has not vomited since he has been in the ED and has been able to take the evening dose of his home p.o. meds.  ED Course: Afebrile.  Labs showing no leukocytosis.  Lipase and LFTs normal.  Bicarb 17, anion gap 11.  BUN 30, creatinine 2.7.  Creatinine was 2.3 on 08/04/2019.  UA not suggestive of infection.  SARS-CoV-2 PCR test negative.  Chest x-[place] showed no active disease.  CT abdomen pelvis negative for acute abnormality.  Patient was given doses of fentanyl, Reglan, Zofran, and 1 L normal saline bolus.  Review of Systems:  All systems reviewed and apart from history of presenting illness, are negative.  Past Medical History:  Diagnosis Date  . Cancer ([place])   . Hypertension   . Renal disorder     Past Surgical History:  Procedure Laterality Date  . TRANSPLANTATION RENAL       reports that he has never smoked. He has never used smokeless tobacco. He reports current drug use. Drug: Marijuana. He reports that he does not drink alcohol.  No Known Allergies  History reviewed. No pertinent family history.  Prior to Admission medications   Medication Sig Start Date End Date Taking? Authorizing Provider  amLODipine (NORVASC) 10 MG tablet Take 10 mg by  mouth daily. 07/12/14  Yes [provider]  aspirin EC 81 MG tablet Take 81 mg by mouth daily. 06/06/15  Yes [provider]  HYDROcodone-acetaminophen (NORCO/VICODIN) 5-325 MG tablet Take 1 tablet by mouth every 8 (eight) hours as needed for moderate pain.  10/11/16  Yes [provider]  labetalol (NORMODYNE) 200 MG tablet Take 200 mg by mouth 2 (two) times daily.  06/16/14  Yes [provider]  magnesium oxide (MAG-OX) 400 MG tablet Take 400 mg by mouth 2 (two) times daily. 07/30/16  Yes [provider]  mycophenolate (MYFORTIC) 180 MG EC tablet Take 720 mg by mouth 2 (two) times daily. 08/08/16  Yes [provider]  predniSONE (DELTASONE) 5 MG tablet Take 5 mg by mouth daily. 07/07/19  Yes [provider]  pregabalin (LYRICA) 75 MG capsule Take 75 mg by mouth 2 (two) times daily as needed (pain).   Yes [provider]  sulfamethoxazole-trimethoprim (BACTRIM,SEPTRA) 400-80 MG tablet Take 1 tablet by mouth 3 (three) times a week. Mon / Wed / Fri 06/21/16  Yes [provider]  Tacrolimus ER (ENVARSUS XR) 4 MG TB24 Take 8 mg by mouth daily.   Yes [provider]    Physical Exam: Vitals:   09/15/19 1245 09/15/19 1300 09/15/19 2246 09/15/19 2325  BP: (!) 165/86 (!) 153/86 130/67 (!) 142/85  Pulse: 81 84 90 83  Resp: 16 (!) 24  18  Temp:  99.3 F (37.4 C) 99.4 F (37.4 C)  TempSrc:   Oral Oral  SpO2: 96% 96% 95% 95%  Weight:    81.4 kg  Height:    6\' 1"  (1.854 m)    Physical Exam Constitutional:      General: He is not in acute distress.    Appearance: He is not diaphoretic.  HENT:     Head: Normocephalic.  Eyes:     Extraocular Movements: Extraocular movements intact.  Cardiovascular:     Rate and Rhythm: Normal rate and regular rhythm.     Heart sounds: Normal heart sounds.  Pulmonary:     Effort: Pulmonary effort is normal. No respiratory distress.     Breath sounds: Normal breath sounds. No  wheezing or rales.  Abdominal:     General: Bowel sounds are normal. There is no distension.     Palpations: Abdomen is soft.     Tenderness: There is abdominal tenderness in the right lower quadrant. There is no guarding or rebound.  Musculoskeletal:        General: No tenderness.  Skin:    General: Skin is warm and dry.  Neurological:     Mental Status: He is alert and oriented to person, place, and time.     Labs on Admission: I have personally reviewed following labs and imaging studies  CBC: Recent Labs  Lab 09/15/19 1147  WBC 5.3  NEUTROABS 4.3  HGB 13.5  HCT 39.0  MCV 94.4  PLT 401   Basic Metabolic Panel: Recent Labs  Lab 09/15/19 1147  NA 135  K 4.6  CL 107  CO2 17*  GLUCOSE 112*  BUN 30*  CREATININE 2.76*  CALCIUM 8.7*   GFR: Estimated Creatinine Clearance: 32.2 mL/min (A) (by C-G formula based on SCr of 2.76 mg/dL (H)). Liver Function Tests: Recent Labs  Lab 09/15/19 1147  AST 26  ALT 17  ALKPHOS 63  BILITOT 0.4  PROT 6.5  ALBUMIN 3.5   Recent Labs  Lab 09/15/19 1147  LIPASE 35   No results for input(s): AMMONIA in the last 168 hours. Coagulation Profile: No results for input(s): INR, PROTIME in the last 168 hours. Cardiac Enzymes: No results for input(s): CKTOTAL, CKMB, CKMBINDEX, TROPONINI in the last 168 hours. BNP (last 3 results) No results for input(s): PROBNP in the last 8760 hours. HbA1C: No results for input(s): HGBA1C in the last 72 hours. CBG: No results for input(s): GLUCAP in the last 168 hours. Lipid Profile: No results for input(s): CHOL, HDL, LDLCALC, TRIG, CHOLHDL, LDLDIRECT in the last 72 hours. Thyroid Function Tests: No results for input(s): TSH, T4TOTAL, FREET4, T3FREE, THYROIDAB in the last 72 hours. Anemia Panel: No results for input(s): VITAMINB12, FOLATE, FERRITIN, TIBC, IRON, RETICCTPCT in the last 72 hours. Urine analysis:    Component Value Date/Time   COLORURINE YELLOW 09/15/2019 [place] 09/15/2019 1755   LABSPEC 1.009 09/15/2019 1755   PHURINE 5.0 09/15/2019 1755   GLUCOSEU NEGATIVE 09/15/2019 1755   HGBUR MODERATE (A) 09/15/2019 1755   BILIRUBINUR NEGATIVE 09/15/2019 1755   KETONESUR NEGATIVE 09/15/2019 1755   PROTEINUR >=300 (A) 09/15/2019 1755   UROBILINOGEN 0.2 07/30/2014 1320   NITRITE NEGATIVE 09/15/2019 1755   LEUKOCYTESUR NEGATIVE 09/15/2019 1755    Radiological Exams on Admission: CT ABDOMEN PELVIS WO CONTRAST  Result Date: 09/15/2019 CLINICAL DATA:  Right lower quadrant pain with nausea, vomiting and diarrhea for 2 weeks. History of a right lower quadrant renal transplant. EXAM:  CT ABDOMEN AND PELVIS WITHOUT CONTRAST TECHNIQUE: Multidetector CT imaging of the abdomen and pelvis was performed following the standard protocol without IV contrast. COMPARISON:  None. FINDINGS: Lower chest: Lung bases clear.  No pleural or pericardial effusion. Hepatobiliary: No focal liver abnormality is seen. No gallstones, gallbladder wall thickening, or biliary dilatation. Pancreas: Unremarkable. No pancreatic ductal dilatation or surrounding inflammatory changes. Spleen: Normal in size without focal abnormality. Adrenals/Urinary Tract: The adrenal glands appear normal. The native kidneys are markedly atrophic consistent with chronic renal failure. The patient has a right lower quadrant renal transplant. No mass, stone or hydronephrosis is seen. There is no fluid collection about the transplant. Urinary bladder appears normal. Stomach/Bowel: Stomach is within normal limits. Appendix appears normal. No evidence of bowel wall thickening, distention, or inflammatory changes. Vascular/Lymphatic: Aortic atherosclerosis. No enlarged abdominal or pelvic lymph nodes. Reproductive: Mild prostatomegaly. Other: None. Musculoskeletal: No acute or worrisome lesion. 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 IMPRESSION: No  acute abnormality abdomen or pelvis. No finding to explain the patient's symptoms. Right lower quadrant renal transplant is identified without evidence of complication. Aortic Atherosclerosis (ICD10-I70.0). Electronically Signed   By: Inge Rise M.D.   On: 09/15/2019 16:58   DG Chest Port 1 View  Result Date: 09/15/2019 CLINICAL DATA:  Cough EXAM: PORTABLE CHEST 1 VIEW COMPARISON:  07/30/2014 FINDINGS: Cardiac shadow is within normal limits. The lungs are well aerated bilaterally. No focal infiltrate or sizable effusion is seen. No bony abnormality is noted. IMPRESSION: No active disease. Electronically Signed   By: Inez Catalina M.D.   On: 09/15/2019 12:17    EKG: Independently reviewed.  Sinus rhythm.  Assessment/Plan Principal Problem:   Abdominal pain, vomiting, and diarrhea Active Problems:   AKI (acute kidney injury) ([place])   Renal transplant recipient   Metabolic acidosis   Cough   Abdominal pain, vomiting, diarrhea: Likely due to viral gastroenteritis.  Regular marijuana use also likely contributing to nausea and vomiting.  No leukocytosis.  Lipase and LFTs normal.  SARS-CoV-2 PCR test negative.  CT abdomen pelvis negative for acute abnormality.  Right lower quadrant renal transplant without evidence of complication on CT.  Patient states he has not vomited since he has been in the ED and was able to tolerate p.o. meds. -Continue IV fluid hydration, antiemetic as needed, pain management.  Order GI pathogen panel and follow enteric precautions given complaints of diarrhea.  AKI on CKD stage III-IV with history of renal transplant in 2017: Likely prerenal due to dehydration in the setting of vomiting and diarrhea.  BUN 30, creatinine 2.7.  Creatinine was 2.3 on 08/04/2019. -IV fluid hydration.  Monitor urine output and renal function.  Avoid nephrotoxic agents/contrast.  Continue home mycophenolate, tacrolimus, and prednisone.  Normal anion gap metabolic acidosis: Likely related to  AKI.  Bicarb 17, anion gap 11. -IV fluid hydration and continue to monitor BMP  Cough: Patient reports having a productive cough.  Lungs clear on exam and no signs of respiratory distress.  Not tachypneic or hypoxic.  SARS-CoV-2 PCR test negative.  No fever or leukocytosis to suggest pneumonia.  Not on an ACE inhibitor.  Not endorsing any GERD symptoms.  Denies history of cigarette smoking.  Cough possibly due to acute bronchitis. -Mucinex DM, consider obtaining chest x-[place]aschke if no improvement  Hypertension: Systolic currently in the 130s to 140s. -Continue home amlodipine  DVT prophylaxis: Subcutaneous heparin Code Status: Full code Family Communication:  No family available at this time. Disposition Plan: Status is: Observation  The patient remains OBS appropriate and will d/c before 2 midnights.  Dispo: The patient is from: Home              Anticipated d/c is to: Home              Anticipated d/c date is: 1 day              Patient currently is not medically stable to d/c.  The medical decision making on this patient was of high complexity and the patient is at high risk for clinical deterioration, therefore this is a level 3 visit.  Shela Leff MD [facility]  If 7PM-7AM, please contact night-coverage [email or web address]  09/16/2019, 12:02 AM

## 2019-09-15 NOTE — Discharge Planning (Signed)
EDCM to follow for disposition needs.  

## 2019-09-15 NOTE — ED Provider Notes (Signed)
[facility] EMERGENCY DEPARTMENT Provider Note   CSN: [id number] Arrival date & time: 09/15/19  1121     History Chief Complaint  Patient presents with  . Abdominal Pain    Adam Dillon is a 59 y.o. male.  HPI    Patient with a history of renal transplant presents with abdominal pain, nausea, vomiting. Onset was 3-ish days ago.  Since that time the patient has had persistent nausea, anorexia, inability to take his medication over the past day. Pain is diffuse, primarily in the right abdomen, with little left-sided discomfort. There is no objective fever, though the patient and his wife note that he has had fevers and chills, subjectively. There is associated mild cough. With inability to tolerate oral meds, fluids, there are no clear alleviating or exacerbating factors. The patient's renal transplant was in 2017 at [facility]. Past Medical History:  Diagnosis Date  . Cancer ([place])   . Hypertension   . Renal disorder     There are no problems to display for this patient.   No past surgical history on file.     No family history on file.  Social History   Tobacco Use  . Smoking status: Never Smoker  . Smokeless tobacco: Never Used  Substance Use Topics  . Alcohol use: No  . Drug use: No    Home Medications Prior to Admission medications   Medication Sig Start Date End Date Taking? Authorizing Provider  amLODipine (NORVASC) 10 MG tablet Take 10 mg by mouth daily. 07/12/14   [provider]  amoxicillin (AMOXIL) 500 MG capsule Take 500 mg by mouth 3 (three) times daily. 10/11/16   [provider]  aspirin EC 81 MG tablet Take 81 mg by mouth daily. 06/06/15   [provider]  bimatoprost (LUMIGAN) 0.03 % ophthalmic solution Place 1 drop into both eyes at bedtime.    [provider]  COMBIGAN 0.2-0.5 % ophthalmic solution Place 1 drop into both eyes daily. 06/08/14   [provider]    HYDROcodone-acetaminophen (NORCO/VICODIN) 5-325 MG tablet Take 1 tablet by mouth daily as needed for pain. 10/11/16   [provider]  labetalol (NORMODYNE) 300 MG tablet Take 600 mg by mouth 2 (two) times daily. 06/16/14   [provider]  magnesium oxide (MAG-OX) 400 MG tablet Take 400 mg by mouth 2 (two) times daily. 07/30/16   [provider]  Multiple Vitamins-Minerals (MULTIVITAMIN WITH MINERALS) tablet Take 1 tablet by mouth daily.    [provider]  mycophenolate (MYFORTIC) 180 MG EC tablet Take 720 mg by mouth 2 (two) times daily. 08/08/16   [provider]  ranitidine (ZANTAC) 150 MG tablet Take 150 mg by mouth at bedtime. 06/25/16   [provider]  sulfamethoxazole-trimethoprim (BACTRIM,SEPTRA) 400-80 MG tablet Take 1 tablet by mouth 3 (three) times a week. Mon / Wed / Fri 06/21/16   [provider]  Tacrolimus 1 MG TB24 Take 10 mg by mouth daily. 08/08/16   [provider]    Allergies    Patient has no known allergies.  Review of Systems   Review of Systems  Constitutional:       Per HPI, otherwise negative  HENT:       Per HPI, otherwise negative  Respiratory:       Per HPI, otherwise negative  Cardiovascular:       Per HPI, otherwise negative  Gastrointestinal: Positive for abdominal pain and vomiting.  Endocrine:       Negative aside from HPI  Genitourinary:       Neg aside from HPI   Musculoskeletal:       Per HPI, otherwise negative  Skin: Negative.   Allergic/Immunologic: Positive for immunocompromised state.  Neurological: Positive for weakness. Negative for syncope.    Physical Exam Updated Vital Signs BP (!) 154/85 (BP Location: Left Arm)   Pulse 76   Temp 98.5 F (36.9 C) (Oral)   Resp (!) 22   Ht 6\' 1"  (1.854 m)   Wt 86.2 kg   SpO2 99%   BMI 25.07 kg/m   Physical Exam Vitals and nursing note reviewed.  Constitutional:      Appearance: He is well-developed. He is ill-appearing  and diaphoretic.  HENT:     Head: Normocephalic and atraumatic.  Eyes:     Conjunctiva/sclera: Conjunctivae normal.  Cardiovascular:     Rate and Rhythm: Normal rate and regular rhythm.  Pulmonary:     Effort: Pulmonary effort is normal. No respiratory distress.     Breath sounds: No stridor.  Abdominal:     General: There is no distension.     Tenderness: There is abdominal tenderness in the right upper quadrant and right lower quadrant. There is guarding.  Skin:    General: Skin is warm.  Neurological:     Mental Status: He is alert and oriented to person, place, and time.      ED Results / Procedures / Treatments   Labs (all labs ordered are listed, but only abnormal results are displayed) Labs Reviewed  COMPREHENSIVE METABOLIC PANEL - Abnormal; Notable for the following components:      Result Value   CO2 17 (*)    Glucose, Bld 112 (*)    BUN 30 (*)    Creatinine, Ser 2.76 (*)    Calcium 8.7 (*)    GFR calc non Af Amer 24 (*)    GFR calc Af Amer 28 (*)    All other components within normal limits  CBC WITH DIFFERENTIAL/PLATELET - Abnormal; Notable for the following components:   RBC 4.13 (*)    nRBC 0.4 (*)    Lymphs Abs 0.3 (*)    All other components within normal limits  LIPASE, BLOOD  URINALYSIS, ROUTINE W REFLEX MICROSCOPIC    EKG EKG Interpretation  Date/Time:  Tuesday September 15 2019 11:30:11 EDT Ventricular Rate:  75 PR Interval:    QRS Duration: 95 QT Interval:  371 QTC Calculation: 415 R Axis:   70 Text Interpretation: Sinus rhythm unremarkable ecg Confirmed by Carmin Muskrat [phone]) on 09/15/2019 11:56:57 AM   Radiology DG Chest Port 1 View  Result Date: 09/15/2019 CLINICAL DATA:  Cough EXAM: PORTABLE CHEST 1 VIEW COMPARISON:  07/30/2014 FINDINGS: Cardiac shadow is within normal limits. The lungs are well aerated bilaterally. No focal infiltrate or sizable effusion is seen. No bony abnormality is noted. IMPRESSION: No active disease.  Electronically Signed   By: Inez Catalina M.D.   On: 09/15/2019 12:17    Procedures Procedures (including critical care time)  Medications Ordered in ED Medications  sodium chloride 0.9 % bolus 1,000 mL (1,000 mLs Intravenous New Bag/Given 09/15/19 1200)    And  0.9 %  sodium chloride infusion (has no administration in time range)  fentaNYL (SUBLIMAZE) injection 25 mcg (25 mcg Intravenous Given 09/15/19 1201)  ondansetron (ZOFRAN) injection 4 mg (4 mg Intravenous Given 09/15/19 1201)    ED Course  I have  reviewed the triage vital signs and the nursing notes.  Pertinent labs & imaging results that were available during my care of the patient were reviewed by me and considered in my medical decision making (see chart for details).     Patient with multiple medical issues including prior kidney transplant now presents with abdominal pain, nausea, vomiting.  Patient is awake, alert, but with substantial pain, there is consideration of infection versus obstruction primarily.  Patient's initial findings are notable for reassuring evidence of no substantial decline in renal function, no leukocytosis.  However, given the patient's status post kidney transplant, immunocompromise state, additional labs, CT scan are pending.  Dr. Ralene Bathe is aware of the patient.  MDM Number of Diagnoses or Management Options Bilious vomiting with nausea: new, needed workup Right lower quadrant abdominal pain: new, needed workup   Amount and/or Complexity of Data Reviewed Clinical lab tests: reviewed Tests in the radiology section of CPT: reviewed Tests in the medicine section of CPT: reviewed Decide to obtain previous medical records or to obtain history from someone other than the patient: yes Obtain history from someone other than the patient: yes Review and summarize past medical records: yes Independent visualization of images, tracings, or specimens: yes  Risk of Complications, Morbidity, and/or  Mortality Presenting problems: high Diagnostic procedures: high Management options: high  Critical Care Total time providing critical care: < 30 minutes  Patient Progress Patient progress: stable   Final Clinical Impression(s) / ED Diagnoses Final diagnoses:  Right lower quadrant abdominal pain  Bilious vomiting with nausea     Carmin Muskrat, MD 09/15/19 1543

## 2019-09-16 ENCOUNTER — Encounter (HOSPITAL_COMMUNITY): Payer: Self-pay | Admitting: Internal Medicine

## 2019-09-16 ENCOUNTER — Other Ambulatory Visit: Payer: Self-pay

## 2019-09-16 DIAGNOSIS — Z79899 Other long term (current) drug therapy: Secondary | ICD-10-CM | POA: Diagnosis not present

## 2019-09-16 DIAGNOSIS — D849 Immunodeficiency, unspecified: Secondary | ICD-10-CM | POA: Diagnosis present

## 2019-09-16 DIAGNOSIS — N179 Acute kidney failure, unspecified: Secondary | ICD-10-CM

## 2019-09-16 DIAGNOSIS — Y830 Surgical operation with transplant of whole organ as the cause of abnormal reaction of the patient, or of later complication, without mention of misadventure at the time of the procedure: Secondary | ICD-10-CM | POA: Diagnosis present

## 2019-09-16 DIAGNOSIS — R112 Nausea with vomiting, unspecified: Secondary | ICD-10-CM | POA: Diagnosis present

## 2019-09-16 DIAGNOSIS — Z940 Kidney transplant status: Secondary | ICD-10-CM | POA: Diagnosis not present

## 2019-09-16 DIAGNOSIS — B1920 Unspecified viral hepatitis C without hepatic coma: Secondary | ICD-10-CM | POA: Diagnosis present

## 2019-09-16 DIAGNOSIS — Z20822 Contact with and (suspected) exposure to covid-19: Secondary | ICD-10-CM | POA: Diagnosis present

## 2019-09-16 DIAGNOSIS — T8619 Other complication of kidney transplant: Secondary | ICD-10-CM | POA: Diagnosis present

## 2019-09-16 DIAGNOSIS — N184 Chronic kidney disease, stage 4 (severe): Secondary | ICD-10-CM | POA: Diagnosis present

## 2019-09-16 DIAGNOSIS — I129 Hypertensive chronic kidney disease with stage 1 through stage 4 chronic kidney disease, or unspecified chronic kidney disease: Secondary | ICD-10-CM | POA: Diagnosis present

## 2019-09-16 DIAGNOSIS — R059 Cough, unspecified: Secondary | ICD-10-CM

## 2019-09-16 DIAGNOSIS — R1114 Bilious vomiting: Secondary | ICD-10-CM | POA: Diagnosis not present

## 2019-09-16 DIAGNOSIS — R109 Unspecified abdominal pain: Secondary | ICD-10-CM | POA: Diagnosis not present

## 2019-09-16 DIAGNOSIS — Z7982 Long term (current) use of aspirin: Secondary | ICD-10-CM | POA: Diagnosis not present

## 2019-09-16 DIAGNOSIS — R05 Cough: Secondary | ICD-10-CM | POA: Diagnosis present

## 2019-09-16 DIAGNOSIS — E872 Acidosis, unspecified: Secondary | ICD-10-CM

## 2019-09-16 DIAGNOSIS — E860 Dehydration: Secondary | ICD-10-CM | POA: Diagnosis present

## 2019-09-16 DIAGNOSIS — A084 Viral intestinal infection, unspecified: Secondary | ICD-10-CM | POA: Diagnosis present

## 2019-09-16 DIAGNOSIS — F1290 Cannabis use, unspecified, uncomplicated: Secondary | ICD-10-CM | POA: Diagnosis present

## 2019-09-16 LAB — RAPID URINE DRUG SCREEN, HOSP PERFORMED
Amphetamines: NOT DETECTED
Barbiturates: NOT DETECTED
Benzodiazepines: NOT DETECTED
Cocaine: POSITIVE — AB
Opiates: NOT DETECTED
Tetrahydrocannabinol: POSITIVE — AB

## 2019-09-16 LAB — BASIC METABOLIC PANEL
Anion gap: 11 (ref 5–15)
BUN: 24 mg/dL — ABNORMAL HIGH (ref 6–20)
CO2: 19 mmol/L — ABNORMAL LOW (ref 22–32)
Calcium: 8.5 mg/dL — ABNORMAL LOW (ref 8.9–10.3)
Chloride: 109 mmol/L (ref 98–111)
Creatinine, Ser: 2.73 mg/dL — ABNORMAL HIGH (ref 0.61–1.24)
GFR calc Af Amer: 28 mL/min — ABNORMAL LOW (ref >60)
GFR calc non Af Amer: 24 mL/min — ABNORMAL LOW (ref >60)
Glucose, Bld: 103 mg/dL — ABNORMAL HIGH (ref 70–99)
Potassium: 4.1 mmol/L (ref 3.5–5.1)
Sodium: 139 mmol/L (ref 135–145)

## 2019-09-16 LAB — URINE CULTURE: Culture: NO GROWTH

## 2019-09-16 LAB — HIV ANTIBODY (ROUTINE TESTING W REFLEX): HIV Screen 4th Generation wRfx: NONREACTIVE

## 2019-09-16 MED ORDER — TACROLIMUS ER 4 MG PO TB24
8.0000 mg | ORAL_TABLET | Freq: Every day | ORAL | Status: DC
  Administered 2019-09-17: 8 mg via ORAL
  Filled 2019-09-16 (×2): qty 2

## 2019-09-16 NOTE — Progress Notes (Signed)
New Admission Note:   Arrival Method:  stretcher Mental Orientation: alert x 4 Telemetry: none Assessment: Completed Skin:  see flow sheet IV: left Forearm Pain: none Tubes: none Safety Measures: Safety Fall Prevention Plan has been discussed Admission: Completed [facility] Orientation: Patient has been orientated to the room, unit and staff.  Family: none at bedside  Orders have been reviewed and implemented. Will continue to monitor the patient. Call light has been placed within reach and bed alarm has been activated.   Rockie Neighbours BSN, RN Phone number: [phone]

## 2019-09-16 NOTE — Progress Notes (Signed)
PROGRESS NOTE    LASH MATULICH  IBB:[id number] DOB: May 06, 1960 DOA: 09/15/2019 PCP: System, Pcp Not In    Brief Narrative:  59 y.o. 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  The pain is in the right lower quadrant of his abdomen where he has this transplanted kidney.  He has also had watery diarrhea.  He has been vaccinated for Covid.  Reports having a productive cough.  Denies shortness of breath or chest pain.  Reports using marijuana on a regular basis.  Patient states he has not vomited since he has been in the ED and has been able to take the evening dose of his home p.o. meds.  ED Course: Afebrile.  Labs showing no leukocytosis.  Lipase and LFTs normal.  Bicarb 17, anion gap 11.  BUN 30, creatinine 2.7.  Creatinine was 2.3 on 08/04/2019.  UA not suggestive of infection.  SARS-CoV-2 PCR test negative.  Assessment & Plan:   Principal Problem:   Abdominal pain, vomiting, and diarrhea Active Problems:   AKI (acute kidney injury) ([place])   Renal transplant recipient   Metabolic acidosis   Cough   Intractable nausea and vomiting    Abdominal pain, vomiting, diarrhea: Suspected due to viral gastroenteritis.  Regular marijuana may also likely contributing to nausea and vomiting.  No leukocytosis noted.  Lipase and LFTs normal.  SARS-CoV-2 PCR test negative.  CT abdomen pelvis negative for acute abnormality, reviewed.  Right lower quadrant renal transplant without evidence of complication on CT.  Patient states he has not vomited since he has been in the ED and was able to tolerate p.o. meds. -Pt is continued on IV fluid hydration, continue antiemetic as needed, continue with analgesics as needed.   -Pt with continued watery stool this AM, stool culture underway -Pt reports feeling nauseated, unable to tolerate diet  this afternoon. Have downgraded diet to clears. Continue IVF  AKI on CKD stage III-IV with history of renal transplant in 2017: Likely prerenal due to dehydration in the setting of vomiting and diarrhea.  BUN 30, creatinine 2.7.  Creatinine was 2.3 on 08/04/2019. -IV fluid hydration.  Monitor urine output and renal function.  Avoid nephrotoxic agents/contrast.  Continue home mycophenolate, tacrolimus, and prednisone. -Will check BK and CMV levels  Normal anion gap metabolic acidosis: Likely related to AKI.  Bicarb 17, anion gap 11. -continue IVF hydration as tolerated  Cough: Patient reports having a productive cough.  Lungs clear on exam and no signs of respiratory distress.  Not tachypneic or hypoxic.  SARS-CoV-2 PCR test negative.  No fever or leukocytosis to suggest pneumonia.  Not on an ACE inhibitor.  Not endorsing any GERD symptoms.  Denies history of cigarette smoking.  Cough possibly due to acute bronchitis. -Mucinex DM -Seems stable at present  Hypertension: Systolic currently in the 160's -Continue home amlodipine as tolerated  DVT prophylaxis: Heparin subq Code Status: Full Family Communication: Pt in room, family at bedside  Status is: Inpatient  Remains inpatient appropriate because:IV treatments appropriate due to intensity of illness or inability to take PO   Dispo: The patient is from: Home              Anticipated d/c is to: Home              Anticipated d/c date is: 2 days  Patient currently is not medically stable to d/c.    Consultants:   Procedures:     Antimicrobials: Anti-infectives (From admission, onward)   None       Subjective: Still nauseated, unable to tolerate regular diet today. Still having watery diarrhea  Objective: Vitals:   09/15/19 2246 09/15/19 2325 09/16/19 0329 09/16/19 0918  BP: 130/67 (!) 142/85 (!) 147/88 (!) 159/84  Pulse: 90 83 77 67  Resp:  18 18 18   Temp: 99.3 F (37.4 C) 99.4 F (37.4 C) 100.2 F  (37.9 C) 98.3 F (36.8 C)  TempSrc: Oral Oral Oral Oral  SpO2: 95% 95% 97% 97%  Weight:  81.4 kg    Height:  6\' 1"  (1.854 m)      Intake/Output Summary (Last 24 hours) at 09/16/2019 1638 Last data filed at 09/16/2019 1300 Gross per 24 hour  Intake 2361.46 ml  Output 400 ml  Net 1961.46 ml   Filed Weights   09/15/19 1127 09/15/19 1144 09/15/19 2325  Weight: 87.1 kg 86.2 kg 81.4 kg    Examination:  General exam: Appears calm and comfortable  Respiratory system: Clear to auscultation. Respiratory effort normal. Cardiovascular system: S1 & S2 heard, Regular Gastrointestinal system: Abdomen is nondistended, soft and nontender. No organomegaly or masses felt. Normal bowel sounds heard. Central nervous system: Alert and oriented. No focal neurological deficits. Extremities: Symmetric 5 x 5 power. Skin: No rashes, lesions  Psychiatry: Judgement and insight appear normal. Mood & affect appropriate.   Data Reviewed: I have personally reviewed following labs and imaging studies  CBC: Recent Labs  Lab 09/15/19 1147  WBC 5.3  NEUTROABS 4.3  HGB 13.5  HCT 39.0  MCV 94.4  PLT 151   Basic Metabolic Panel: Recent Labs  Lab 09/15/19 1147 09/16/19 0619  NA 135 139  K 4.6 4.1  CL 107 109  CO2 17* 19*  GLUCOSE 112* 103*  BUN 30* 24*  CREATININE 2.76* 2.73*  CALCIUM 8.7* 8.5*   GFR: Estimated Creatinine Clearance: 32.5 mL/min (A) (by C-G formula based on SCr of 2.73 mg/dL (H)). Liver Function Tests: Recent Labs  Lab 09/15/19 1147  AST 26  ALT 17  ALKPHOS 63  BILITOT 0.4  PROT 6.5  ALBUMIN 3.5   Recent Labs  Lab 09/15/19 1147  LIPASE 35   No results for input(s): AMMONIA in the last 168 hours. Coagulation Profile: No results for input(s): INR, PROTIME in the last 168 hours. Cardiac Enzymes: No results for input(s): CKTOTAL, CKMB, CKMBINDEX, TROPONINI in the last 168 hours. BNP (last 3 results) No results for input(s): PROBNP in the last 8760  hours. HbA1C: No results for input(s): HGBA1C in the last 72 hours. CBG: No results for input(s): GLUCAP in the last 168 hours. Lipid Profile: No results for input(s): CHOL, HDL, LDLCALC, TRIG, CHOLHDL, LDLDIRECT in the last 72 hours. Thyroid Function Tests: No results for input(s): TSH, T4TOTAL, FREET4, T3FREE, THYROIDAB in the last 72 hours. Anemia Panel: No results for input(s): VITAMINB12, FOLATE, FERRITIN, TIBC, IRON, RETICCTPCT in the last 72 hours. Sepsis Labs: No results for input(s): PROCALCITON, LATICACIDVEN in the last 168 hours.  Recent Results (from the past 240 hour(s))  Urine culture     Status: None   Collection Time: 09/15/19  6:03 PM   Specimen: Urine, Random  Result Value Ref Range Status   Specimen Description URINE, RANDOM  Final   Special Requests NONE  Final   Culture   Final    NO GROWTH Performed  at [facility], [place] [address] [place], [place] [address]    Report Status 09/16/2019 FINAL  Final  SARS Coronavirus 2 by RT PCR (hospital order, performed in [facility] hospital lab) Nasopharyngeal Nasopharyngeal Swab     Status: None   Collection Time: 09/15/19  6:30 PM   Specimen: Nasopharyngeal Swab  Result Value Ref Range Status   SARS Coronavirus 2 NEGATIVE NEGATIVE Final    Comment: (NOTE) SARS-CoV-2 target nucleic acids are NOT DETECTED.  The SARS-CoV-2 RNA is generally detectable in upper and lower respiratory specimens during the acute phase of infection. The lowest concentration of SARS-CoV-2 viral copies this assay can detect is 250 copies / mL. A negative result does not preclude SARS-CoV-2 infection and should not be used as the sole basis for treatment or other patient management decisions.  A negative result may occur with improper specimen collection / handling, submission of specimen other than nasopharyngeal swab, presence of viral mutation(s) within the areas targeted by this assay, and inadequate number of viral copies (<250  copies / mL). A negative result must be combined with clinical observations, patient history, and epidemiological information.  Fact Sheet for Patients:   StrictlyIdeas.no  Fact Sheet for Healthcare Providers: BankingDealers.co.za  This test is not yet approved or  cleared by the [place] FDA and has been authorized for detection and/or diagnosis of SARS-CoV-2 by FDA under an Emergency Use Authorization (EUA).  This EUA will remain in effect (meaning this test can be used) for the duration of the COVID-19 declaration under Section 564(b)(1) of the Act, 21 U.S.C. section 360bbb-3(b)(1), unless the authorization is terminated or revoked sooner.  Performed at [facility], [place] [address], [place], [place] [address]      Radiology Studies: CT ABDOMEN PELVIS WO CONTRAST  Result Date: 09/15/2019 CLINICAL DATA:  Right lower quadrant pain with nausea, vomiting and diarrhea for 2 weeks. History of a right lower quadrant renal transplant. EXAM: CT ABDOMEN AND PELVIS WITHOUT CONTRAST TECHNIQUE: Multidetector CT imaging of the abdomen and pelvis was performed following the standard protocol without IV contrast. COMPARISON:  None. FINDINGS: Lower chest: Lung bases clear.  No pleural or pericardial effusion. Hepatobiliary: No focal liver abnormality is seen. No gallstones, gallbladder wall thickening, or biliary dilatation. Pancreas: Unremarkable. No pancreatic ductal dilatation or surrounding inflammatory changes. Spleen: Normal in size without focal abnormality. Adrenals/Urinary Tract: The adrenal glands appear normal. The native kidneys are markedly atrophic consistent with chronic renal failure. The patient has a right lower quadrant renal transplant. No mass, stone or hydronephrosis is seen. There is no fluid collection about the transplant. Urinary bladder appears normal. Stomach/Bowel: Stomach is within normal limits. Appendix appears normal.  No evidence of bowel wall thickening, distention, or inflammatory changes. Vascular/Lymphatic: Aortic atherosclerosis. No enlarged abdominal or pelvic lymph nodes. Reproductive: Mild prostatomegaly. Other: None. Musculoskeletal: No acute or worrisome lesion. 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 IMPRESSION: No acute abnormality abdomen or pelvis. No finding to explain the patient's symptoms. Right lower quadrant renal transplant is identified without evidence of complication. Aortic Atherosclerosis (ICD10-I70.0). Electronically Signed   By: Inge Rise M.D.   On: 09/15/2019 16:58   DG Chest Port 1 View  Result Date: 09/15/2019 CLINICAL DATA:  Cough EXAM: PORTABLE CHEST 1 VIEW COMPARISON:  07/30/2014 FINDINGS: Cardiac shadow is within normal limits. The lungs are well aerated bilaterally. No focal infiltrate or sizable effusion is seen.  No bony abnormality is noted. IMPRESSION: No active disease. Electronically Signed   By: Inez Catalina M.D.   On: 09/15/2019 12:17    Scheduled Meds:  amLODipine  10 mg Oral Daily   aspirin EC  81 mg Oral Daily   dextromethorphan-guaiFENesin  1 tablet Oral BID   heparin  5,000 Units Subcutaneous Q8H   labetalol  200 mg Oral BID   magnesium oxide  400 mg Oral BID   mycophenolate  720 mg Oral BID   predniSONE  5 mg Oral Daily   Tacrolimus ER  8 mg Oral Daily   Continuous Infusions:  sodium chloride 125 mL/hr at 09/16/19 0857     LOS: 0 days   Marylu Lund, MD [facility] Pager On Amion  If 7PM-7AM, please contact night-coverage 09/16/2019, 4:38 PM

## 2019-09-17 DIAGNOSIS — N179 Acute kidney failure, unspecified: Secondary | ICD-10-CM

## 2019-09-17 LAB — GASTROINTESTINAL PANEL BY PCR, STOOL (REPLACES STOOL CULTURE)

## 2019-09-17 LAB — COMPREHENSIVE METABOLIC PANEL
ALT: 15 U/L (ref 0–44)
AST: 19 U/L (ref 15–41)
Albumin: 2.9 g/dL — ABNORMAL LOW (ref 3.5–5.0)
Alkaline Phosphatase: 58 U/L (ref 38–126)
Anion gap: 10 (ref 5–15)
BUN: 18 mg/dL (ref 6–20)
CO2: 19 mmol/L — ABNORMAL LOW (ref 22–32)
Calcium: 8.4 mg/dL — ABNORMAL LOW (ref 8.9–10.3)
Chloride: 111 mmol/L (ref 98–111)
Creatinine, Ser: 2.25 mg/dL — ABNORMAL HIGH (ref 0.61–1.24)
GFR calc Af Amer: 35 mL/min — ABNORMAL LOW (ref >60)
GFR calc non Af Amer: 31 mL/min — ABNORMAL LOW (ref >60)
Glucose, Bld: 94 mg/dL (ref 70–99)
Potassium: 3.9 mmol/L (ref 3.5–5.1)
Sodium: 140 mmol/L (ref 135–145)
Total Bilirubin: 0.6 mg/dL (ref 0.3–1.2)
Total Protein: 5.8 g/dL — ABNORMAL LOW (ref 6.5–8.1)

## 2019-09-17 NOTE — Discharge Summary (Signed)
Physician Discharge Summary  Adam Dillon PPJ:[id number] DOB: [birth year] DOA: 09/15/2019  PCP: System, Pcp Not In  Admit date: 09/15/2019 Discharge date: 09/17/2019  Admitted From: Home Disposition:  Home  Recommendations for Outpatient Follow-up:  1. Follow up with PCP in 2-3 weeks 2. 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 The pain is in the right lower quadrant of his abdomen where he has this transplanted kidney. He has also had watery diarrhea. He has been vaccinated for Covid. Reports having a productive cough. Denies shortness of breath or chest pain. Reports using marijuana on a regular basis. Patient states he has not vomited since he has been in the ED and has been able to take the evening dose of his home p.o. meds.  ED Course:Afebrile. Labs showing no leukocytosis. Lipase and LFTs normal. Bicarb 17, anion gap 11. BUN 30, creatinine 2.7. Creatinine was 2.3 on 08/04/2019. UA not suggestive of infection. SARS-CoV-2 PCR test negative.  Discharge Diagnoses:  Principal Problem:   Abdominal pain, vomiting, and diarrhea Active Problems:   AKI (acute kidney injury) ([place])   Renal transplant recipient   Metabolic acidosis   Cough   Intractable nausea and vomiting  Abdominal pain,vomiting, diarrhea:Suspected due to viral gastroenteritis. Regular marijuana may also likely contributing to nausea and vomiting. No leukocytosis noted. Lipase and LFTs normal. SARS-CoV-2 PCR test negative.CT abdomen pelvis negative for acute abnormality, reviewed.Right lower quadrant renal transplant without  evidence of complication on CT. Patient states he has not vomited since he has been in the ED and was able to tolerate p.o. meds. -Pt was continued on IV fluid hydration, continue antiemetic as needed, continue with analgesics as needed. -Stool culture returned neg -Nausea gradually resolved and diet was successfully advanced to soft diet. Pt states appetite seems to be returning and pt is eager to go home today  AKI on CKD stage III-IV with history of renal transplant in 2017: Likely prerenal due to dehydration in the setting ofvomiting and diarrhea. BUN 30, creatinine 2.7. Creatinine was 2.3 on 08/04/2019. -Pt was given IV fluid hydration. Monitor urine output and renal function. Avoid nephrotoxic agents/contrast.Continue home mycophenolate, tacrolimus, and prednisone. -BK and CMV levels remain pending at time of d/c  Normal anion gap metabolic acidosis: Likely related to AKI. Bicarb 17, anion gap 11. -Improved with IVF hydration  Cough: Patient reports having a productive cough. Lungs clear on exam and no signs of respiratory distress. Not tachypneic or hypoxic. SARS-CoV-2 PCR test negative. No fever or leukocytosis to suggest pneumonia. Not on an ACE inhibitor. Not endorsing any GERD symptoms. Denies history of cigarette smoking. Cough possibly due to acute bronchitis. -Mucinex DM -Seems stable at present  Hypertension:Systolic currently in the 160's -Continue home amlodipine as pt tolerates   Discharge Instructions   Allergies as of 09/17/2019   No Known Allergies     Medication List    TAKE these medications   amLODipine 10 MG tablet Commonly known as: NORVASC Take 10 mg by mouth daily.   aspirin EC 81 MG tablet Take 81 mg by mouth daily.   Envarsus XR 4 MG Tb24 Generic drug: Tacrolimus ER Take 8 mg by mouth daily.   HYDROcodone-acetaminophen 5-325 MG tablet  Commonly known as: NORCO/VICODIN Take 1 tablet by mouth every 8 (eight) hours as needed  for moderate pain.   labetalol 200 MG tablet Commonly known as: NORMODYNE Take 200 mg by mouth 2 (two) times daily.   magnesium oxide 400 MG tablet Commonly known as: MAG-OX Take 400 mg by mouth 2 (two) times daily.   mycophenolate 180 MG EC tablet Commonly known as: MYFORTIC Take 720 mg by mouth 2 (two) times daily.   predniSONE 5 MG tablet Commonly known as: DELTASONE Take 5 mg by mouth daily.   pregabalin 75 MG capsule Commonly known as: LYRICA Take 75 mg by mouth 2 (two) times daily as needed (pain).   sulfamethoxazole-trimethoprim 400-80 MG tablet Commonly known as: BACTRIM Take 1 tablet by mouth 3 (three) times a week. Mon / Wed / Fri       Follow-up Information    Follow up with your PCP. Schedule an appointment as soon as possible for a visit in 2 week(s).        Dorna Mai, PA-C Follow up.   Specialty: General Surgery Why: as scheduled Contact information: [address] [place] [address] [phone]              No Known Allergies  Procedures/Studies: CT ABDOMEN PELVIS WO CONTRAST  Result Date: 09/15/2019 CLINICAL DATA:  Right lower quadrant pain with nausea, vomiting and diarrhea for 2 weeks. History of a right lower quadrant renal transplant. EXAM: CT ABDOMEN AND PELVIS WITHOUT CONTRAST TECHNIQUE: Multidetector CT imaging of the abdomen and pelvis was performed following the standard protocol without IV contrast. COMPARISON:  None. FINDINGS: Lower chest: Lung bases clear.  No pleural or pericardial effusion. Hepatobiliary: No focal liver abnormality is seen. No gallstones, gallbladder wall thickening, or biliary dilatation. Pancreas: Unremarkable. No pancreatic ductal dilatation or surrounding inflammatory changes. Spleen: Normal in size without focal abnormality. Adrenals/Urinary Tract: The adrenal glands appear normal. The native kidneys are markedly atrophic consistent with chronic renal failure. The patient has a right lower  quadrant renal transplant. No mass, stone or hydronephrosis is seen. There is no fluid collection about the transplant. Urinary bladder appears normal. Stomach/Bowel: Stomach is within normal limits. Appendix appears normal. No evidence of bowel wall thickening, distention, or inflammatory changes. Vascular/Lymphatic: Aortic atherosclerosis. No enlarged abdominal or pelvic lymph nodes. Reproductive: Mild prostatomegaly. Other: None. Musculoskeletal: No acute or worrisome lesion. 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 IMPRESSION: No acute abnormality abdomen or pelvis. No finding to explain the patient's symptoms. Right lower quadrant renal transplant is identified without evidence of complication. Aortic Atherosclerosis (ICD10-I70.0). Electronically Signed   By: Inge Rise M.D.   On: 09/15/2019 16:58   DG Chest Port 1 View  Result Date: 09/15/2019 CLINICAL DATA:  Cough EXAM: PORTABLE CHEST 1 VIEW COMPARISON:  07/30/2014 FINDINGS: Cardiac shadow is within normal limits. The lungs are well aerated bilaterally. No focal infiltrate or sizable effusion is seen. No bony abnormality is noted. IMPRESSION: No active disease. Electronically Signed   By: Inez Catalina M.D.   On: 09/15/2019 12:17     Subjective: Eager to go home today  Discharge Exam: Vitals:   09/17/19 0510 09/17/19 0925  BP: (!) 166/89 (!) 162/89  Pulse: 68 63  Resp: 18 20  Temp: 98.4 F (36.9 C) 98 F (36.7 C)  SpO2: 99% 99%   Vitals:   09/16/19 1639 09/16/19 2054 09/17/19 0510 09/17/19 0925  BP: Marland Kitchen)  161/88 (!) 155/80 (!) 166/89 (!) 162/89  Pulse: 69 65 68 63  Resp: 18 18 18 20   Temp: 98.2 F (36.8 C) 98.4 F (36.9 C) 98.4 F (36.9 C) 98 F (36.7 C)  TempSrc: Oral Oral Oral Oral  SpO2: 98% 92% 99% 99%  Weight:   82 kg   Height:        General: Pt is alert, awake, not in acute distress Cardiovascular: RRR, S1/S2 +, no rubs, no  gallops Respiratory: CTA bilaterally, no wheezing, no rhonchi Abdominal: Soft, NT, ND, bowel sounds + Extremities: no edema, no cyanosis   The results of significant diagnostics from this hospitalization (including imaging, microbiology, ancillary and laboratory) are listed below for reference.     Microbiology: Recent Results (from the past 240 hour(s))  Urine culture     Status: None   Collection Time: 09/15/19  6:03 PM   Specimen: Urine, Random  Result Value Ref Range Status   Specimen Description URINE, RANDOM  Final   Special Requests NONE  Final   Culture   Final    NO GROWTH Performed at [facility], [address] [place], [place] [address]    Report Status 09/16/2019 FINAL  Final  SARS Coronavirus 2 by RT PCR (hospital order, performed in [facility] hospital lab) Nasopharyngeal Nasopharyngeal Swab     Status: None   Collection Time: 09/15/19  6:30 PM   Specimen: Nasopharyngeal Swab  Result Value Ref Range Status   SARS Coronavirus 2 NEGATIVE NEGATIVE Final    Comment: (NOTE) SARS-CoV-2 target nucleic acids are NOT DETECTED.  The SARS-CoV-2 RNA is generally detectable in upper and lower respiratory specimens during the acute phase of infection. The lowest concentration of SARS-CoV-2 viral copies this assay can detect is 250 copies / mL. A negative result does not preclude SARS-CoV-2 infection and should not be used as the sole basis for treatment or other patient management decisions.  A negative result may occur with improper specimen collection / handling, submission of specimen other than nasopharyngeal swab, presence of viral mutation(s) within the areas targeted by this assay, and inadequate number of viral copies (<250 copies / mL). A negative result must be combined with clinical observations, patient history, and epidemiological information.  Fact Sheet for Patients:   StrictlyIdeas.no  Fact Sheet for Healthcare  Providers: BankingDealers.co.za  This test is not yet approved or  cleared by the [place] FDA and has been authorized for detection and/or diagnosis of SARS-CoV-2 by FDA under an Emergency Use Authorization (EUA).  This EUA will remain in effect (meaning this test can be used) for the duration of the COVID-19 declaration under Section 564(b)(1) of the Act, 21 U.S.C. section 360bbb-3(b)(1), unless the authorization is terminated or revoked sooner.  Performed at [facility], [place] [address], [place], [place] [address]   Gastrointestinal Panel by PCR , Stool     Status: None   Collection Time: 09/16/19 12:35 PM   Specimen: Stool  Result Value Ref Range Status   Campylobacter species NOT DETECTED NOT DETECTED Final   Plesimonas shigelloides NOT DETECTED NOT DETECTED Final   Salmonella species NOT DETECTED NOT DETECTED Final   Yersinia enterocolitica NOT DETECTED NOT DETECTED Final   Vibrio species NOT DETECTED NOT DETECTED Final   Vibrio cholerae NOT DETECTED NOT DETECTED Final   Enteroaggregative E coli (EAEC) NOT DETECTED NOT DETECTED Final   Enteropathogenic E coli (EPEC) NOT DETECTED NOT DETECTED Final   Enterotoxigenic E coli (  ETEC) NOT DETECTED NOT DETECTED Final   Shiga like toxin producing E coli (STEC) NOT DETECTED NOT DETECTED Final   Shigella/Enteroinvasive E coli (EIEC) NOT DETECTED NOT DETECTED Final   Cryptosporidium NOT DETECTED NOT DETECTED Final   Cyclospora cayetanensis NOT DETECTED NOT DETECTED Final   Entamoeba histolytica NOT DETECTED NOT DETECTED Final   Giardia lamblia NOT DETECTED NOT DETECTED Final   Adenovirus F40/41 NOT DETECTED NOT DETECTED Final   Astrovirus NOT DETECTED NOT DETECTED Final   Norovirus GI/GII NOT DETECTED NOT DETECTED Final   Rotavirus A NOT DETECTED NOT DETECTED Final   Sapovirus (I, II, IV, and V) NOT DETECTED NOT DETECTED Final    Comment: Performed at [facility], [address]  [place], [place] [address]     Labs: BNP (last 3 results) No results for input(s): BNP in the last 8760 hours. Basic Metabolic Panel: Recent Labs  Lab 09/15/19 1147 09/16/19 0619 09/17/19 0615  NA 135 139 140  K 4.6 4.1 3.9  CL 107 109 111  CO2 17* 19* 19*  GLUCOSE 112* 103* 94  BUN 30* 24* 18  CREATININE 2.76* 2.73* 2.25*  CALCIUM 8.7* 8.5* 8.4*   Liver Function Tests: Recent Labs  Lab 09/15/19 1147 09/17/19 0615  AST 26 19  ALT 17 15  ALKPHOS 63 58  BILITOT 0.4 0.6  PROT 6.5 5.8*  ALBUMIN 3.5 2.9*   Recent Labs  Lab 09/15/19 1147  LIPASE 35   No results for input(s): AMMONIA in the last 168 hours. CBC: Recent Labs  Lab 09/15/19 1147  WBC 5.3  NEUTROABS 4.3  HGB 13.5  HCT 39.0  MCV 94.4  PLT 228   Cardiac Enzymes: No results for input(s): CKTOTAL, CKMB, CKMBINDEX, TROPONINI in the last 168 hours. BNP: Invalid input(s): POCBNP CBG: No results for input(s): GLUCAP in the last 168 hours. D-Dimer No results for input(s): DDIMER in the last 72 hours. Hgb A1c No results for input(s): HGBA1C in the last 72 hours. Lipid Profile No results for input(s): CHOL, HDL, LDLCALC, TRIG, CHOLHDL, LDLDIRECT in the last 72 hours. Thyroid function studies No results for input(s): TSH, T4TOTAL, T3FREE, THYROIDAB in the last 72 hours.  Invalid input(s): FREET3 Anemia work up No results for input(s): VITAMINB12, FOLATE, FERRITIN, TIBC, IRON, RETICCTPCT in the last 72 hours. Urinalysis    Component Value Date/Time   COLORURINE YELLOW 09/15/2019 1755   APPEARANCEUR CLEAR 09/15/2019 1755   LABSPEC 1.009 09/15/2019 1755   PHURINE 5.0 09/15/2019 1755   GLUCOSEU NEGATIVE 09/15/2019 1755   HGBUR MODERATE (A) 09/15/2019 1755   BILIRUBINUR NEGATIVE 09/15/2019 1755   KETONESUR NEGATIVE 09/15/2019 1755   PROTEINUR >=300 (A) 09/15/2019 1755   UROBILINOGEN 0.2 07/30/2014 1320   NITRITE NEGATIVE 09/15/2019 1755   LEUKOCYTESUR NEGATIVE 09/15/2019 1755   Sepsis Labs Invalid  input(s): PROCALCITONIN,  WBC,  LACTICIDVEN Microbiology Recent Results (from the past 240 hour(s))  Urine culture     Status: None   Collection Time: 09/15/19  6:03 PM   Specimen: Urine, Random  Result Value Ref Range Status   Specimen Description URINE, RANDOM  Final   Special Requests NONE  Final   Culture   Final    NO GROWTH Performed at [facility], [place] [address], [place], [place] [address]    Report Status 09/16/2019 FINAL  Final  SARS Coronavirus 2 by RT PCR (hospital order, performed in [facility] hospital lab) Nasopharyngeal Nasopharyngeal Swab     Status: None  Collection Time: 09/15/19  6:30 PM   Specimen: Nasopharyngeal Swab  Result Value Ref Range Status   SARS Coronavirus 2 NEGATIVE NEGATIVE Final    Comment: (NOTE) SARS-CoV-2 target nucleic acids are NOT DETECTED.  The SARS-CoV-2 RNA is generally detectable in upper and lower respiratory specimens during the acute phase of infection. The lowest concentration of SARS-CoV-2 viral copies this assay can detect is 250 copies / mL. A negative result does not preclude SARS-CoV-2 infection and should not be used as the sole basis for treatment or other patient management decisions.  A negative result may occur with improper specimen collection / handling, submission of specimen other than nasopharyngeal swab, presence of viral mutation(s) within the areas targeted by this assay, and inadequate number of viral copies (<250 copies / mL). A negative result must be combined with clinical observations, patient history, and epidemiological information.  Fact Sheet for Patients:   StrictlyIdeas.no  Fact Sheet for Healthcare Providers: BankingDealers.co.za  This test is not yet approved or  cleared by the [place] FDA and has been authorized for detection and/or diagnosis of SARS-CoV-2 by FDA under an Emergency Use Authorization (EUA).  This EUA will remain in  effect (meaning this test can be used) for the duration of the COVID-19 declaration under Section 564(b)(1) of the Act, 21 U.S.C. section 360bbb-3(b)(1), unless the authorization is terminated or revoked sooner.  Performed at [facility], [place] [address], [place], [place] [address]   Gastrointestinal Panel by PCR , Stool     Status: None   Collection Time: 09/16/19 12:35 PM   Specimen: Stool  Result Value Ref Range Status   Campylobacter species NOT DETECTED NOT DETECTED Final   Plesimonas shigelloides NOT DETECTED NOT DETECTED Final   Salmonella species NOT DETECTED NOT DETECTED Final   Yersinia enterocolitica NOT DETECTED NOT DETECTED Final   Vibrio species NOT DETECTED NOT DETECTED Final   Vibrio cholerae NOT DETECTED NOT DETECTED Final   Enteroaggregative E coli (EAEC) NOT DETECTED NOT DETECTED Final   Enteropathogenic E coli (EPEC) NOT DETECTED NOT DETECTED Final   Enterotoxigenic E coli (ETEC) NOT DETECTED NOT DETECTED Final   Shiga like toxin producing E coli (STEC) NOT DETECTED NOT DETECTED Final   Shigella/Enteroinvasive E coli (EIEC) NOT DETECTED NOT DETECTED Final   Cryptosporidium NOT DETECTED NOT DETECTED Final   Cyclospora cayetanensis NOT DETECTED NOT DETECTED Final   Entamoeba histolytica NOT DETECTED NOT DETECTED Final   Giardia lamblia NOT DETECTED NOT DETECTED Final   Adenovirus F40/41 NOT DETECTED NOT DETECTED Final   Astrovirus NOT DETECTED NOT DETECTED Final   Norovirus GI/GII NOT DETECTED NOT DETECTED Final   Rotavirus A NOT DETECTED NOT DETECTED Final   Sapovirus (I, II, IV, and V) NOT DETECTED NOT DETECTED Final    Comment: Performed at [facility] [place] [facility], [address], [place], [place] [address]   Time spent: 30 min  SIGNED:   Marylu Lund, MD  [facility] 09/17/2019, 3:52 PM  If 7PM-7AM, please contact night-coverage

## 2019-09-17 NOTE — Progress Notes (Signed)
Adam Dillon to be discharged Home  per MD order. Discussed prescriptions and follow up appointments with the patient. Prescriptions given to patient; medication list explained in detail. Patient verbalized understanding.  Skin clean, dry and intact without evidence of skin break down, no evidence of skin tears noted. IV catheter discontinued intact. Site without signs and symptoms of complications. Dressing and pressure applied. Pt denies pain at the site currently. No complaints noted.  Patient free of lines, drains, and wounds.   An After Visit Summary (AVS) was printed and given to the patient. Patient escorted via wheelchair, and discharged home via private auto.  [name], RN

## 2019-09-18 LAB — BK QUANT PCR (PLASMA/SERUM)
BK Quantitaion PCR: NEGATIVE copies/mL
Log10 BK Qn PCR: UNDETERMINED log10copy/mL

## 2019-09-25 LAB — CMV DNA, QUANTITATIVE, PCR

## 2022-05-04 DIAGNOSIS — D509 Iron deficiency anemia, unspecified: Secondary | ICD-10-CM | POA: Diagnosis not present

## 2022-05-04 DIAGNOSIS — D631 Anemia in chronic kidney disease: Secondary | ICD-10-CM | POA: Diagnosis not present

## 2022-05-04 DIAGNOSIS — N2581 Secondary hyperparathyroidism of renal origin: Secondary | ICD-10-CM | POA: Diagnosis not present

## 2022-05-04 DIAGNOSIS — Z23 Encounter for immunization: Secondary | ICD-10-CM | POA: Diagnosis not present

## 2022-05-04 DIAGNOSIS — N186 End stage renal disease: Secondary | ICD-10-CM | POA: Diagnosis not present

## 2022-05-07 DIAGNOSIS — D631 Anemia in chronic kidney disease: Secondary | ICD-10-CM | POA: Diagnosis not present

## 2022-05-07 DIAGNOSIS — N186 End stage renal disease: Secondary | ICD-10-CM | POA: Diagnosis not present

## 2022-05-07 DIAGNOSIS — Z1159 Encounter for screening for other viral diseases: Secondary | ICD-10-CM | POA: Diagnosis not present

## 2022-05-07 DIAGNOSIS — D509 Iron deficiency anemia, unspecified: Secondary | ICD-10-CM | POA: Diagnosis not present

## 2022-05-07 DIAGNOSIS — N2581 Secondary hyperparathyroidism of renal origin: Secondary | ICD-10-CM | POA: Diagnosis not present

## 2022-05-07 DIAGNOSIS — Z23 Encounter for immunization: Secondary | ICD-10-CM | POA: Diagnosis not present

## 2022-05-08 DIAGNOSIS — N186 End stage renal disease: Secondary | ICD-10-CM | POA: Diagnosis not present

## 2022-05-08 DIAGNOSIS — B182 Chronic viral hepatitis C: Secondary | ICD-10-CM | POA: Diagnosis not present

## 2022-05-08 DIAGNOSIS — I120 Hypertensive chronic kidney disease with stage 5 chronic kidney disease or end stage renal disease: Secondary | ICD-10-CM | POA: Diagnosis not present

## 2022-05-08 DIAGNOSIS — Z79899 Other long term (current) drug therapy: Secondary | ICD-10-CM | POA: Diagnosis not present

## 2022-05-08 DIAGNOSIS — Z992 Dependence on renal dialysis: Secondary | ICD-10-CM | POA: Diagnosis not present

## 2022-05-08 DIAGNOSIS — E872 Acidosis, unspecified: Secondary | ICD-10-CM | POA: Diagnosis not present

## 2022-05-08 DIAGNOSIS — Z8616 Personal history of COVID-19: Secondary | ICD-10-CM | POA: Diagnosis not present

## 2022-05-08 DIAGNOSIS — M0569 Rheumatoid arthritis of multiple sites with involvement of other organs and systems: Secondary | ICD-10-CM | POA: Diagnosis not present

## 2022-05-08 DIAGNOSIS — A630 Anogenital (venereal) warts: Secondary | ICD-10-CM | POA: Diagnosis not present

## 2022-05-08 DIAGNOSIS — M0689 Other specified rheumatoid arthritis, multiple sites: Secondary | ICD-10-CM | POA: Diagnosis not present

## 2022-05-08 DIAGNOSIS — D62 Acute posthemorrhagic anemia: Secondary | ICD-10-CM | POA: Diagnosis not present

## 2022-05-08 DIAGNOSIS — M797 Fibromyalgia: Secondary | ICD-10-CM | POA: Diagnosis not present

## 2022-05-11 DIAGNOSIS — N186 End stage renal disease: Secondary | ICD-10-CM | POA: Diagnosis not present

## 2022-05-11 DIAGNOSIS — Z23 Encounter for immunization: Secondary | ICD-10-CM | POA: Diagnosis not present

## 2022-05-11 DIAGNOSIS — D509 Iron deficiency anemia, unspecified: Secondary | ICD-10-CM | POA: Diagnosis not present

## 2022-05-11 DIAGNOSIS — N2581 Secondary hyperparathyroidism of renal origin: Secondary | ICD-10-CM | POA: Diagnosis not present

## 2022-05-11 DIAGNOSIS — D631 Anemia in chronic kidney disease: Secondary | ICD-10-CM | POA: Diagnosis not present

## 2022-05-14 DIAGNOSIS — N2581 Secondary hyperparathyroidism of renal origin: Secondary | ICD-10-CM | POA: Diagnosis not present

## 2022-05-14 DIAGNOSIS — D631 Anemia in chronic kidney disease: Secondary | ICD-10-CM | POA: Diagnosis not present

## 2022-05-14 DIAGNOSIS — Z23 Encounter for immunization: Secondary | ICD-10-CM | POA: Diagnosis not present

## 2022-05-14 DIAGNOSIS — N186 End stage renal disease: Secondary | ICD-10-CM | POA: Diagnosis not present

## 2022-05-14 DIAGNOSIS — D509 Iron deficiency anemia, unspecified: Secondary | ICD-10-CM | POA: Diagnosis not present

## 2022-05-18 DIAGNOSIS — N186 End stage renal disease: Secondary | ICD-10-CM | POA: Diagnosis not present

## 2022-05-18 DIAGNOSIS — D509 Iron deficiency anemia, unspecified: Secondary | ICD-10-CM | POA: Diagnosis not present

## 2022-05-18 DIAGNOSIS — N2581 Secondary hyperparathyroidism of renal origin: Secondary | ICD-10-CM | POA: Diagnosis not present

## 2022-05-18 DIAGNOSIS — Z23 Encounter for immunization: Secondary | ICD-10-CM | POA: Diagnosis not present

## 2022-05-18 DIAGNOSIS — D631 Anemia in chronic kidney disease: Secondary | ICD-10-CM | POA: Diagnosis not present

## 2022-05-21 DIAGNOSIS — N2581 Secondary hyperparathyroidism of renal origin: Secondary | ICD-10-CM | POA: Diagnosis not present

## 2022-05-21 DIAGNOSIS — N186 End stage renal disease: Secondary | ICD-10-CM | POA: Diagnosis not present

## 2022-05-21 DIAGNOSIS — D631 Anemia in chronic kidney disease: Secondary | ICD-10-CM | POA: Diagnosis not present

## 2022-05-21 DIAGNOSIS — D509 Iron deficiency anemia, unspecified: Secondary | ICD-10-CM | POA: Diagnosis not present

## 2022-05-21 DIAGNOSIS — Z23 Encounter for immunization: Secondary | ICD-10-CM | POA: Diagnosis not present

## 2022-05-23 DIAGNOSIS — D631 Anemia in chronic kidney disease: Secondary | ICD-10-CM | POA: Diagnosis not present

## 2022-05-23 DIAGNOSIS — N186 End stage renal disease: Secondary | ICD-10-CM | POA: Diagnosis not present

## 2022-05-24 DIAGNOSIS — N2581 Secondary hyperparathyroidism of renal origin: Secondary | ICD-10-CM | POA: Diagnosis not present

## 2022-05-24 DIAGNOSIS — R188 Other ascites: Secondary | ICD-10-CM | POA: Diagnosis not present

## 2022-05-24 DIAGNOSIS — I700 Atherosclerosis of aorta: Secondary | ICD-10-CM | POA: Diagnosis not present

## 2022-05-24 DIAGNOSIS — N186 End stage renal disease: Secondary | ICD-10-CM | POA: Diagnosis not present

## 2022-05-24 DIAGNOSIS — D509 Iron deficiency anemia, unspecified: Secondary | ICD-10-CM | POA: Diagnosis not present

## 2022-05-24 DIAGNOSIS — Z23 Encounter for immunization: Secondary | ICD-10-CM | POA: Diagnosis not present

## 2022-05-24 DIAGNOSIS — M546 Pain in thoracic spine: Secondary | ICD-10-CM | POA: Diagnosis not present

## 2022-05-24 DIAGNOSIS — J90 Pleural effusion, not elsewhere classified: Secondary | ICD-10-CM | POA: Diagnosis not present

## 2022-05-24 DIAGNOSIS — N2889 Other specified disorders of kidney and ureter: Secondary | ICD-10-CM | POA: Diagnosis not present

## 2022-05-24 DIAGNOSIS — R030 Elevated blood-pressure reading, without diagnosis of hypertension: Secondary | ICD-10-CM | POA: Diagnosis not present

## 2022-05-24 DIAGNOSIS — D631 Anemia in chronic kidney disease: Secondary | ICD-10-CM | POA: Diagnosis not present

## 2022-05-25 DIAGNOSIS — M5412 Radiculopathy, cervical region: Secondary | ICD-10-CM | POA: Diagnosis not present

## 2022-05-25 DIAGNOSIS — M546 Pain in thoracic spine: Secondary | ICD-10-CM | POA: Diagnosis not present

## 2022-05-29 DIAGNOSIS — Z7682 Awaiting organ transplant status: Secondary | ICD-10-CM | POA: Diagnosis not present

## 2022-05-29 DIAGNOSIS — G928 Other toxic encephalopathy: Secondary | ICD-10-CM | POA: Diagnosis not present

## 2022-05-29 DIAGNOSIS — D631 Anemia in chronic kidney disease: Secondary | ICD-10-CM | POA: Diagnosis not present

## 2022-05-29 DIAGNOSIS — N19 Unspecified kidney failure: Secondary | ICD-10-CM | POA: Diagnosis not present

## 2022-05-29 DIAGNOSIS — E8770 Fluid overload, unspecified: Secondary | ICD-10-CM | POA: Diagnosis not present

## 2022-05-29 DIAGNOSIS — D84821 Immunodeficiency due to drugs: Secondary | ICD-10-CM | POA: Diagnosis not present

## 2022-05-29 DIAGNOSIS — Z992 Dependence on renal dialysis: Secondary | ICD-10-CM | POA: Diagnosis not present

## 2022-05-29 DIAGNOSIS — J811 Chronic pulmonary edema: Secondary | ICD-10-CM | POA: Diagnosis not present

## 2022-05-29 DIAGNOSIS — N186 End stage renal disease: Secondary | ICD-10-CM | POA: Diagnosis not present

## 2022-05-29 DIAGNOSIS — R000 Tachycardia, unspecified: Secondary | ICD-10-CM | POA: Diagnosis not present

## 2022-05-29 DIAGNOSIS — E875 Hyperkalemia: Secondary | ICD-10-CM | POA: Diagnosis not present

## 2022-05-29 DIAGNOSIS — I129 Hypertensive chronic kidney disease with stage 1 through stage 4 chronic kidney disease, or unspecified chronic kidney disease: Secondary | ICD-10-CM | POA: Diagnosis not present

## 2022-05-29 DIAGNOSIS — I160 Hypertensive urgency: Secondary | ICD-10-CM | POA: Diagnosis not present

## 2022-05-29 DIAGNOSIS — Z7982 Long term (current) use of aspirin: Secondary | ICD-10-CM | POA: Diagnosis not present

## 2022-05-29 DIAGNOSIS — I10 Essential (primary) hypertension: Secondary | ICD-10-CM | POA: Diagnosis not present

## 2022-05-29 DIAGNOSIS — G8929 Other chronic pain: Secondary | ICD-10-CM | POA: Diagnosis not present

## 2022-05-29 DIAGNOSIS — Z940 Kidney transplant status: Secondary | ICD-10-CM | POA: Diagnosis not present

## 2022-05-29 DIAGNOSIS — M549 Dorsalgia, unspecified: Secondary | ICD-10-CM | POA: Diagnosis not present

## 2022-05-29 DIAGNOSIS — R4182 Altered mental status, unspecified: Secondary | ICD-10-CM | POA: Diagnosis not present

## 2022-05-29 DIAGNOSIS — I120 Hypertensive chronic kidney disease with stage 5 chronic kidney disease or end stage renal disease: Secondary | ICD-10-CM | POA: Diagnosis not present

## 2022-05-29 DIAGNOSIS — T428X1A Poisoning by antiparkinsonism drugs and other central muscle-tone depressants, accidental (unintentional), initial encounter: Secondary | ICD-10-CM | POA: Diagnosis not present

## 2022-05-29 DIAGNOSIS — R9431 Abnormal electrocardiogram [ECG] [EKG]: Secondary | ICD-10-CM | POA: Diagnosis not present

## 2022-05-29 DIAGNOSIS — M069 Rheumatoid arthritis, unspecified: Secondary | ICD-10-CM | POA: Diagnosis not present

## 2022-05-30 DIAGNOSIS — I10 Essential (primary) hypertension: Secondary | ICD-10-CM | POA: Diagnosis not present

## 2022-05-30 DIAGNOSIS — N186 End stage renal disease: Secondary | ICD-10-CM | POA: Diagnosis not present

## 2022-05-30 DIAGNOSIS — Z7982 Long term (current) use of aspirin: Secondary | ICD-10-CM | POA: Diagnosis not present

## 2022-05-30 DIAGNOSIS — Z940 Kidney transplant status: Secondary | ICD-10-CM | POA: Diagnosis not present

## 2022-05-30 DIAGNOSIS — R4182 Altered mental status, unspecified: Secondary | ICD-10-CM | POA: Diagnosis not present

## 2022-05-30 DIAGNOSIS — Z992 Dependence on renal dialysis: Secondary | ICD-10-CM | POA: Diagnosis not present

## 2022-05-30 DIAGNOSIS — E875 Hyperkalemia: Secondary | ICD-10-CM | POA: Diagnosis not present

## 2022-05-30 DIAGNOSIS — I120 Hypertensive chronic kidney disease with stage 5 chronic kidney disease or end stage renal disease: Secondary | ICD-10-CM | POA: Diagnosis not present

## 2022-05-31 DIAGNOSIS — E875 Hyperkalemia: Secondary | ICD-10-CM | POA: Diagnosis not present

## 2022-05-31 DIAGNOSIS — I10 Essential (primary) hypertension: Secondary | ICD-10-CM | POA: Diagnosis not present

## 2022-05-31 DIAGNOSIS — N186 End stage renal disease: Secondary | ICD-10-CM | POA: Diagnosis not present

## 2022-05-31 DIAGNOSIS — I120 Hypertensive chronic kidney disease with stage 5 chronic kidney disease or end stage renal disease: Secondary | ICD-10-CM | POA: Diagnosis not present

## 2022-05-31 DIAGNOSIS — Z7982 Long term (current) use of aspirin: Secondary | ICD-10-CM | POA: Diagnosis not present

## 2022-05-31 DIAGNOSIS — Z992 Dependence on renal dialysis: Secondary | ICD-10-CM | POA: Diagnosis not present

## 2022-05-31 DIAGNOSIS — R4182 Altered mental status, unspecified: Secondary | ICD-10-CM | POA: Diagnosis not present

## 2022-05-31 DIAGNOSIS — M549 Dorsalgia, unspecified: Secondary | ICD-10-CM | POA: Diagnosis not present

## 2022-05-31 DIAGNOSIS — R9431 Abnormal electrocardiogram [ECG] [EKG]: Secondary | ICD-10-CM | POA: Diagnosis not present

## 2022-05-31 DIAGNOSIS — Z940 Kidney transplant status: Secondary | ICD-10-CM | POA: Diagnosis not present

## 2022-06-01 DIAGNOSIS — Z940 Kidney transplant status: Secondary | ICD-10-CM | POA: Diagnosis not present

## 2022-06-01 DIAGNOSIS — G8929 Other chronic pain: Secondary | ICD-10-CM | POA: Diagnosis not present

## 2022-06-01 DIAGNOSIS — D631 Anemia in chronic kidney disease: Secondary | ICD-10-CM | POA: Diagnosis not present

## 2022-06-01 DIAGNOSIS — Z992 Dependence on renal dialysis: Secondary | ICD-10-CM | POA: Diagnosis not present

## 2022-06-01 DIAGNOSIS — G928 Other toxic encephalopathy: Secondary | ICD-10-CM | POA: Diagnosis not present

## 2022-06-01 DIAGNOSIS — N186 End stage renal disease: Secondary | ICD-10-CM | POA: Diagnosis not present

## 2022-06-01 DIAGNOSIS — R4182 Altered mental status, unspecified: Secondary | ICD-10-CM | POA: Diagnosis not present

## 2022-06-01 DIAGNOSIS — E875 Hyperkalemia: Secondary | ICD-10-CM | POA: Diagnosis not present

## 2022-06-01 DIAGNOSIS — T428X1A Poisoning by antiparkinsonism drugs and other central muscle-tone depressants, accidental (unintentional), initial encounter: Secondary | ICD-10-CM | POA: Diagnosis not present

## 2022-06-01 DIAGNOSIS — I10 Essential (primary) hypertension: Secondary | ICD-10-CM | POA: Diagnosis not present

## 2022-06-01 DIAGNOSIS — I129 Hypertensive chronic kidney disease with stage 1 through stage 4 chronic kidney disease, or unspecified chronic kidney disease: Secondary | ICD-10-CM | POA: Diagnosis not present

## 2022-06-01 DIAGNOSIS — M549 Dorsalgia, unspecified: Secondary | ICD-10-CM | POA: Diagnosis not present

## 2022-06-01 DIAGNOSIS — I160 Hypertensive urgency: Secondary | ICD-10-CM | POA: Diagnosis not present

## 2022-06-04 DIAGNOSIS — N186 End stage renal disease: Secondary | ICD-10-CM | POA: Diagnosis not present

## 2022-06-04 DIAGNOSIS — Z23 Encounter for immunization: Secondary | ICD-10-CM | POA: Diagnosis not present

## 2022-06-04 DIAGNOSIS — D631 Anemia in chronic kidney disease: Secondary | ICD-10-CM | POA: Diagnosis not present

## 2022-06-04 DIAGNOSIS — D509 Iron deficiency anemia, unspecified: Secondary | ICD-10-CM | POA: Diagnosis not present

## 2022-06-04 DIAGNOSIS — N2581 Secondary hyperparathyroidism of renal origin: Secondary | ICD-10-CM | POA: Diagnosis not present

## 2022-06-05 DIAGNOSIS — M545 Low back pain, unspecified: Secondary | ICD-10-CM | POA: Diagnosis not present

## 2022-06-08 DIAGNOSIS — N186 End stage renal disease: Secondary | ICD-10-CM | POA: Diagnosis not present

## 2022-06-08 DIAGNOSIS — D509 Iron deficiency anemia, unspecified: Secondary | ICD-10-CM | POA: Diagnosis not present

## 2022-06-08 DIAGNOSIS — D631 Anemia in chronic kidney disease: Secondary | ICD-10-CM | POA: Diagnosis not present

## 2022-06-08 DIAGNOSIS — Z23 Encounter for immunization: Secondary | ICD-10-CM | POA: Diagnosis not present

## 2022-06-08 DIAGNOSIS — N2581 Secondary hyperparathyroidism of renal origin: Secondary | ICD-10-CM | POA: Diagnosis not present

## 2022-06-11 DIAGNOSIS — D509 Iron deficiency anemia, unspecified: Secondary | ICD-10-CM | POA: Diagnosis not present

## 2022-06-11 DIAGNOSIS — N2581 Secondary hyperparathyroidism of renal origin: Secondary | ICD-10-CM | POA: Diagnosis not present

## 2022-06-11 DIAGNOSIS — D631 Anemia in chronic kidney disease: Secondary | ICD-10-CM | POA: Diagnosis not present

## 2022-06-11 DIAGNOSIS — Z23 Encounter for immunization: Secondary | ICD-10-CM | POA: Diagnosis not present

## 2022-06-11 DIAGNOSIS — N186 End stage renal disease: Secondary | ICD-10-CM | POA: Diagnosis not present

## 2022-06-13 DIAGNOSIS — M5412 Radiculopathy, cervical region: Secondary | ICD-10-CM | POA: Diagnosis not present

## 2022-06-15 DIAGNOSIS — D509 Iron deficiency anemia, unspecified: Secondary | ICD-10-CM | POA: Diagnosis not present

## 2022-06-15 DIAGNOSIS — N186 End stage renal disease: Secondary | ICD-10-CM | POA: Diagnosis not present

## 2022-06-15 DIAGNOSIS — Z23 Encounter for immunization: Secondary | ICD-10-CM | POA: Diagnosis not present

## 2022-06-15 DIAGNOSIS — N2581 Secondary hyperparathyroidism of renal origin: Secondary | ICD-10-CM | POA: Diagnosis not present

## 2022-06-15 DIAGNOSIS — D631 Anemia in chronic kidney disease: Secondary | ICD-10-CM | POA: Diagnosis not present

## 2022-06-18 DIAGNOSIS — D631 Anemia in chronic kidney disease: Secondary | ICD-10-CM | POA: Diagnosis not present

## 2022-06-18 DIAGNOSIS — N186 End stage renal disease: Secondary | ICD-10-CM | POA: Diagnosis not present

## 2022-06-18 DIAGNOSIS — N2581 Secondary hyperparathyroidism of renal origin: Secondary | ICD-10-CM | POA: Diagnosis not present

## 2022-06-18 DIAGNOSIS — Z23 Encounter for immunization: Secondary | ICD-10-CM | POA: Diagnosis not present

## 2022-06-18 DIAGNOSIS — D509 Iron deficiency anemia, unspecified: Secondary | ICD-10-CM | POA: Diagnosis not present

## 2022-06-20 DIAGNOSIS — D509 Iron deficiency anemia, unspecified: Secondary | ICD-10-CM | POA: Diagnosis not present

## 2022-06-20 DIAGNOSIS — D631 Anemia in chronic kidney disease: Secondary | ICD-10-CM | POA: Diagnosis not present

## 2022-06-20 DIAGNOSIS — Z23 Encounter for immunization: Secondary | ICD-10-CM | POA: Diagnosis not present

## 2022-06-20 DIAGNOSIS — N186 End stage renal disease: Secondary | ICD-10-CM | POA: Diagnosis not present

## 2022-06-20 DIAGNOSIS — N2581 Secondary hyperparathyroidism of renal origin: Secondary | ICD-10-CM | POA: Diagnosis not present

## 2022-06-21 DIAGNOSIS — D849 Immunodeficiency, unspecified: Secondary | ICD-10-CM | POA: Diagnosis not present

## 2022-06-21 DIAGNOSIS — A630 Anogenital (venereal) warts: Secondary | ICD-10-CM | POA: Diagnosis not present

## 2022-06-22 DIAGNOSIS — N2581 Secondary hyperparathyroidism of renal origin: Secondary | ICD-10-CM | POA: Diagnosis not present

## 2022-06-22 DIAGNOSIS — N186 End stage renal disease: Secondary | ICD-10-CM | POA: Diagnosis not present

## 2022-06-22 DIAGNOSIS — D509 Iron deficiency anemia, unspecified: Secondary | ICD-10-CM | POA: Diagnosis not present

## 2022-06-22 DIAGNOSIS — Z23 Encounter for immunization: Secondary | ICD-10-CM | POA: Diagnosis not present

## 2022-06-22 DIAGNOSIS — D631 Anemia in chronic kidney disease: Secondary | ICD-10-CM | POA: Diagnosis not present

## 2022-06-25 DIAGNOSIS — N186 End stage renal disease: Secondary | ICD-10-CM | POA: Diagnosis not present

## 2022-06-25 DIAGNOSIS — D631 Anemia in chronic kidney disease: Secondary | ICD-10-CM | POA: Diagnosis not present

## 2022-06-25 DIAGNOSIS — N2581 Secondary hyperparathyroidism of renal origin: Secondary | ICD-10-CM | POA: Diagnosis not present

## 2022-06-25 DIAGNOSIS — Z23 Encounter for immunization: Secondary | ICD-10-CM | POA: Diagnosis not present

## 2022-06-25 DIAGNOSIS — D509 Iron deficiency anemia, unspecified: Secondary | ICD-10-CM | POA: Diagnosis not present

## 2022-06-27 DIAGNOSIS — K648 Other hemorrhoids: Secondary | ICD-10-CM | POA: Diagnosis not present

## 2022-06-27 DIAGNOSIS — Z23 Encounter for immunization: Secondary | ICD-10-CM | POA: Diagnosis not present

## 2022-06-27 DIAGNOSIS — A630 Anogenital (venereal) warts: Secondary | ICD-10-CM | POA: Diagnosis not present

## 2022-06-27 DIAGNOSIS — D631 Anemia in chronic kidney disease: Secondary | ICD-10-CM | POA: Diagnosis not present

## 2022-06-27 DIAGNOSIS — D509 Iron deficiency anemia, unspecified: Secondary | ICD-10-CM | POA: Diagnosis not present

## 2022-06-27 DIAGNOSIS — N2581 Secondary hyperparathyroidism of renal origin: Secondary | ICD-10-CM | POA: Diagnosis not present

## 2022-06-27 DIAGNOSIS — N186 End stage renal disease: Secondary | ICD-10-CM | POA: Diagnosis not present

## 2022-06-29 DIAGNOSIS — D631 Anemia in chronic kidney disease: Secondary | ICD-10-CM | POA: Diagnosis not present

## 2022-06-29 DIAGNOSIS — D509 Iron deficiency anemia, unspecified: Secondary | ICD-10-CM | POA: Diagnosis not present

## 2022-06-29 DIAGNOSIS — N2581 Secondary hyperparathyroidism of renal origin: Secondary | ICD-10-CM | POA: Diagnosis not present

## 2022-06-29 DIAGNOSIS — Z23 Encounter for immunization: Secondary | ICD-10-CM | POA: Diagnosis not present

## 2022-06-29 DIAGNOSIS — N186 End stage renal disease: Secondary | ICD-10-CM | POA: Diagnosis not present

## 2022-07-02 DIAGNOSIS — D509 Iron deficiency anemia, unspecified: Secondary | ICD-10-CM | POA: Diagnosis not present

## 2022-07-02 DIAGNOSIS — N2581 Secondary hyperparathyroidism of renal origin: Secondary | ICD-10-CM | POA: Diagnosis not present

## 2022-07-02 DIAGNOSIS — D631 Anemia in chronic kidney disease: Secondary | ICD-10-CM | POA: Diagnosis not present

## 2022-07-02 DIAGNOSIS — N186 End stage renal disease: Secondary | ICD-10-CM | POA: Diagnosis not present

## 2022-07-02 DIAGNOSIS — I10 Essential (primary) hypertension: Secondary | ICD-10-CM | POA: Diagnosis not present

## 2022-07-04 DIAGNOSIS — A630 Anogenital (venereal) warts: Secondary | ICD-10-CM | POA: Diagnosis not present

## 2022-07-04 DIAGNOSIS — K648 Other hemorrhoids: Secondary | ICD-10-CM | POA: Diagnosis not present

## 2022-07-04 DIAGNOSIS — M5412 Radiculopathy, cervical region: Secondary | ICD-10-CM | POA: Diagnosis not present

## 2022-07-06 DIAGNOSIS — N2581 Secondary hyperparathyroidism of renal origin: Secondary | ICD-10-CM | POA: Diagnosis not present

## 2022-07-06 DIAGNOSIS — D509 Iron deficiency anemia, unspecified: Secondary | ICD-10-CM | POA: Diagnosis not present

## 2022-07-06 DIAGNOSIS — D631 Anemia in chronic kidney disease: Secondary | ICD-10-CM | POA: Diagnosis not present

## 2022-07-06 DIAGNOSIS — N186 End stage renal disease: Secondary | ICD-10-CM | POA: Diagnosis not present

## 2022-07-11 DIAGNOSIS — D631 Anemia in chronic kidney disease: Secondary | ICD-10-CM | POA: Diagnosis not present

## 2022-07-11 DIAGNOSIS — I340 Nonrheumatic mitral (valve) insufficiency: Secondary | ICD-10-CM | POA: Diagnosis not present

## 2022-07-11 DIAGNOSIS — I5033 Acute on chronic diastolic (congestive) heart failure: Secondary | ICD-10-CM | POA: Diagnosis not present

## 2022-07-11 DIAGNOSIS — R9431 Abnormal electrocardiogram [ECG] [EKG]: Secondary | ICD-10-CM | POA: Diagnosis not present

## 2022-07-11 DIAGNOSIS — G9389 Other specified disorders of brain: Secondary | ICD-10-CM | POA: Diagnosis not present

## 2022-07-11 DIAGNOSIS — I509 Heart failure, unspecified: Secondary | ICD-10-CM | POA: Diagnosis not present

## 2022-07-11 DIAGNOSIS — I160 Hypertensive urgency: Secondary | ICD-10-CM | POA: Diagnosis not present

## 2022-07-11 DIAGNOSIS — R0602 Shortness of breath: Secondary | ICD-10-CM | POA: Diagnosis not present

## 2022-07-11 DIAGNOSIS — Z992 Dependence on renal dialysis: Secondary | ICD-10-CM | POA: Diagnosis not present

## 2022-07-11 DIAGNOSIS — I120 Hypertensive chronic kidney disease with stage 5 chronic kidney disease or end stage renal disease: Secondary | ICD-10-CM | POA: Diagnosis not present

## 2022-07-11 DIAGNOSIS — N186 End stage renal disease: Secondary | ICD-10-CM | POA: Diagnosis not present

## 2022-07-11 DIAGNOSIS — Z940 Kidney transplant status: Secondary | ICD-10-CM | POA: Diagnosis not present

## 2022-07-11 DIAGNOSIS — R531 Weakness: Secondary | ICD-10-CM | POA: Diagnosis not present

## 2022-07-11 DIAGNOSIS — I132 Hypertensive heart and chronic kidney disease with heart failure and with stage 5 chronic kidney disease, or end stage renal disease: Secondary | ICD-10-CM | POA: Diagnosis not present

## 2022-07-11 DIAGNOSIS — J9601 Acute respiratory failure with hypoxia: Secondary | ICD-10-CM | POA: Diagnosis not present

## 2022-07-11 DIAGNOSIS — I10 Essential (primary) hypertension: Secondary | ICD-10-CM | POA: Diagnosis not present

## 2022-07-11 DIAGNOSIS — D84821 Immunodeficiency due to drugs: Secondary | ICD-10-CM | POA: Diagnosis not present

## 2022-07-11 DIAGNOSIS — B182 Chronic viral hepatitis C: Secondary | ICD-10-CM | POA: Diagnosis not present

## 2022-07-11 DIAGNOSIS — E875 Hyperkalemia: Secondary | ICD-10-CM | POA: Diagnosis not present

## 2022-07-11 DIAGNOSIS — I272 Pulmonary hypertension, unspecified: Secondary | ICD-10-CM | POA: Diagnosis not present

## 2022-07-11 DIAGNOSIS — D649 Anemia, unspecified: Secondary | ICD-10-CM | POA: Diagnosis not present

## 2022-07-11 DIAGNOSIS — T8612 Kidney transplant failure: Secondary | ICD-10-CM | POA: Diagnosis not present

## 2022-07-18 DIAGNOSIS — D509 Iron deficiency anemia, unspecified: Secondary | ICD-10-CM | POA: Diagnosis not present

## 2022-07-18 DIAGNOSIS — N186 End stage renal disease: Secondary | ICD-10-CM | POA: Diagnosis not present

## 2022-07-18 DIAGNOSIS — D631 Anemia in chronic kidney disease: Secondary | ICD-10-CM | POA: Diagnosis not present

## 2022-07-18 DIAGNOSIS — N2581 Secondary hyperparathyroidism of renal origin: Secondary | ICD-10-CM | POA: Diagnosis not present

## 2022-07-19 DIAGNOSIS — A630 Anogenital (venereal) warts: Secondary | ICD-10-CM | POA: Diagnosis not present

## 2022-07-23 DIAGNOSIS — D509 Iron deficiency anemia, unspecified: Secondary | ICD-10-CM | POA: Diagnosis not present

## 2022-07-23 DIAGNOSIS — N186 End stage renal disease: Secondary | ICD-10-CM | POA: Diagnosis not present

## 2022-07-23 DIAGNOSIS — N2581 Secondary hyperparathyroidism of renal origin: Secondary | ICD-10-CM | POA: Diagnosis not present

## 2022-07-23 DIAGNOSIS — D631 Anemia in chronic kidney disease: Secondary | ICD-10-CM | POA: Diagnosis not present

## 2022-07-25 DIAGNOSIS — N2581 Secondary hyperparathyroidism of renal origin: Secondary | ICD-10-CM | POA: Diagnosis not present

## 2022-07-25 DIAGNOSIS — D509 Iron deficiency anemia, unspecified: Secondary | ICD-10-CM | POA: Diagnosis not present

## 2022-07-25 DIAGNOSIS — D631 Anemia in chronic kidney disease: Secondary | ICD-10-CM | POA: Diagnosis not present

## 2022-07-25 DIAGNOSIS — N186 End stage renal disease: Secondary | ICD-10-CM | POA: Diagnosis not present

## 2022-07-27 DIAGNOSIS — D631 Anemia in chronic kidney disease: Secondary | ICD-10-CM | POA: Diagnosis not present

## 2022-07-27 DIAGNOSIS — N2581 Secondary hyperparathyroidism of renal origin: Secondary | ICD-10-CM | POA: Diagnosis not present

## 2022-07-27 DIAGNOSIS — D509 Iron deficiency anemia, unspecified: Secondary | ICD-10-CM | POA: Diagnosis not present

## 2022-07-27 DIAGNOSIS — N186 End stage renal disease: Secondary | ICD-10-CM | POA: Diagnosis not present

## 2022-07-30 DIAGNOSIS — N186 End stage renal disease: Secondary | ICD-10-CM | POA: Diagnosis not present

## 2022-07-30 DIAGNOSIS — D631 Anemia in chronic kidney disease: Secondary | ICD-10-CM | POA: Diagnosis not present

## 2022-07-30 DIAGNOSIS — D509 Iron deficiency anemia, unspecified: Secondary | ICD-10-CM | POA: Diagnosis not present

## 2022-07-30 DIAGNOSIS — N2581 Secondary hyperparathyroidism of renal origin: Secondary | ICD-10-CM | POA: Diagnosis not present

## 2022-08-01 DIAGNOSIS — N2581 Secondary hyperparathyroidism of renal origin: Secondary | ICD-10-CM | POA: Diagnosis not present

## 2022-08-01 DIAGNOSIS — Z09 Encounter for follow-up examination after completed treatment for conditions other than malignant neoplasm: Secondary | ICD-10-CM | POA: Diagnosis not present

## 2022-08-01 DIAGNOSIS — I10 Essential (primary) hypertension: Secondary | ICD-10-CM | POA: Diagnosis not present

## 2022-08-01 DIAGNOSIS — D631 Anemia in chronic kidney disease: Secondary | ICD-10-CM | POA: Diagnosis not present

## 2022-08-01 DIAGNOSIS — D509 Iron deficiency anemia, unspecified: Secondary | ICD-10-CM | POA: Diagnosis not present

## 2022-08-01 DIAGNOSIS — N186 End stage renal disease: Secondary | ICD-10-CM | POA: Diagnosis not present

## 2022-08-03 DIAGNOSIS — D631 Anemia in chronic kidney disease: Secondary | ICD-10-CM | POA: Diagnosis not present

## 2022-08-03 DIAGNOSIS — N186 End stage renal disease: Secondary | ICD-10-CM | POA: Diagnosis not present

## 2022-08-03 DIAGNOSIS — N2581 Secondary hyperparathyroidism of renal origin: Secondary | ICD-10-CM | POA: Diagnosis not present

## 2022-08-03 DIAGNOSIS — I10 Essential (primary) hypertension: Secondary | ICD-10-CM | POA: Diagnosis not present

## 2022-08-03 DIAGNOSIS — D509 Iron deficiency anemia, unspecified: Secondary | ICD-10-CM | POA: Diagnosis not present

## 2022-08-06 DIAGNOSIS — N2581 Secondary hyperparathyroidism of renal origin: Secondary | ICD-10-CM | POA: Diagnosis not present

## 2022-08-06 DIAGNOSIS — D509 Iron deficiency anemia, unspecified: Secondary | ICD-10-CM | POA: Diagnosis not present

## 2022-08-06 DIAGNOSIS — D631 Anemia in chronic kidney disease: Secondary | ICD-10-CM | POA: Diagnosis not present

## 2022-08-06 DIAGNOSIS — I10 Essential (primary) hypertension: Secondary | ICD-10-CM | POA: Diagnosis not present

## 2022-08-06 DIAGNOSIS — N186 End stage renal disease: Secondary | ICD-10-CM | POA: Diagnosis not present

## 2022-08-08 DIAGNOSIS — I10 Essential (primary) hypertension: Secondary | ICD-10-CM | POA: Diagnosis not present

## 2022-08-08 DIAGNOSIS — D509 Iron deficiency anemia, unspecified: Secondary | ICD-10-CM | POA: Diagnosis not present

## 2022-08-08 DIAGNOSIS — N2581 Secondary hyperparathyroidism of renal origin: Secondary | ICD-10-CM | POA: Diagnosis not present

## 2022-08-08 DIAGNOSIS — D631 Anemia in chronic kidney disease: Secondary | ICD-10-CM | POA: Diagnosis not present

## 2022-08-08 DIAGNOSIS — N186 End stage renal disease: Secondary | ICD-10-CM | POA: Diagnosis not present

## 2022-08-10 DIAGNOSIS — D631 Anemia in chronic kidney disease: Secondary | ICD-10-CM | POA: Diagnosis not present

## 2022-08-10 DIAGNOSIS — N186 End stage renal disease: Secondary | ICD-10-CM | POA: Diagnosis not present

## 2022-08-10 DIAGNOSIS — D509 Iron deficiency anemia, unspecified: Secondary | ICD-10-CM | POA: Diagnosis not present

## 2022-08-10 DIAGNOSIS — I10 Essential (primary) hypertension: Secondary | ICD-10-CM | POA: Diagnosis not present

## 2022-08-10 DIAGNOSIS — N2581 Secondary hyperparathyroidism of renal origin: Secondary | ICD-10-CM | POA: Diagnosis not present

## 2022-08-13 DIAGNOSIS — I10 Essential (primary) hypertension: Secondary | ICD-10-CM | POA: Diagnosis not present

## 2022-08-13 DIAGNOSIS — E785 Hyperlipidemia, unspecified: Secondary | ICD-10-CM | POA: Diagnosis not present

## 2022-08-13 DIAGNOSIS — Z131 Encounter for screening for diabetes mellitus: Secondary | ICD-10-CM | POA: Diagnosis not present

## 2022-08-13 DIAGNOSIS — Z992 Dependence on renal dialysis: Secondary | ICD-10-CM | POA: Diagnosis not present

## 2022-08-13 DIAGNOSIS — N186 End stage renal disease: Secondary | ICD-10-CM | POA: Diagnosis not present

## 2022-08-15 DIAGNOSIS — D631 Anemia in chronic kidney disease: Secondary | ICD-10-CM | POA: Diagnosis not present

## 2022-08-15 DIAGNOSIS — N2581 Secondary hyperparathyroidism of renal origin: Secondary | ICD-10-CM | POA: Diagnosis not present

## 2022-08-15 DIAGNOSIS — I10 Essential (primary) hypertension: Secondary | ICD-10-CM | POA: Diagnosis not present

## 2022-08-15 DIAGNOSIS — N186 End stage renal disease: Secondary | ICD-10-CM | POA: Diagnosis not present

## 2022-08-15 DIAGNOSIS — D509 Iron deficiency anemia, unspecified: Secondary | ICD-10-CM | POA: Diagnosis not present

## 2022-08-17 DIAGNOSIS — N186 End stage renal disease: Secondary | ICD-10-CM | POA: Diagnosis not present

## 2022-08-17 DIAGNOSIS — D509 Iron deficiency anemia, unspecified: Secondary | ICD-10-CM | POA: Diagnosis not present

## 2022-08-17 DIAGNOSIS — I10 Essential (primary) hypertension: Secondary | ICD-10-CM | POA: Diagnosis not present

## 2022-08-17 DIAGNOSIS — N2581 Secondary hyperparathyroidism of renal origin: Secondary | ICD-10-CM | POA: Diagnosis not present

## 2022-08-17 DIAGNOSIS — D631 Anemia in chronic kidney disease: Secondary | ICD-10-CM | POA: Diagnosis not present

## 2022-08-20 DIAGNOSIS — N186 End stage renal disease: Secondary | ICD-10-CM | POA: Diagnosis not present

## 2022-08-20 DIAGNOSIS — N2581 Secondary hyperparathyroidism of renal origin: Secondary | ICD-10-CM | POA: Diagnosis not present

## 2022-08-20 DIAGNOSIS — I10 Essential (primary) hypertension: Secondary | ICD-10-CM | POA: Diagnosis not present

## 2022-08-20 DIAGNOSIS — D631 Anemia in chronic kidney disease: Secondary | ICD-10-CM | POA: Diagnosis not present

## 2022-08-20 DIAGNOSIS — D509 Iron deficiency anemia, unspecified: Secondary | ICD-10-CM | POA: Diagnosis not present

## 2022-08-21 DIAGNOSIS — N186 End stage renal disease: Secondary | ICD-10-CM | POA: Diagnosis not present

## 2022-08-21 DIAGNOSIS — I119 Hypertensive heart disease without heart failure: Secondary | ICD-10-CM | POA: Diagnosis not present

## 2022-08-21 DIAGNOSIS — M5412 Radiculopathy, cervical region: Secondary | ICD-10-CM | POA: Diagnosis not present

## 2022-08-22 DIAGNOSIS — I10 Essential (primary) hypertension: Secondary | ICD-10-CM | POA: Diagnosis not present

## 2022-08-22 DIAGNOSIS — D509 Iron deficiency anemia, unspecified: Secondary | ICD-10-CM | POA: Diagnosis not present

## 2022-08-22 DIAGNOSIS — N186 End stage renal disease: Secondary | ICD-10-CM | POA: Diagnosis not present

## 2022-08-22 DIAGNOSIS — N2581 Secondary hyperparathyroidism of renal origin: Secondary | ICD-10-CM | POA: Diagnosis not present

## 2022-08-22 DIAGNOSIS — D631 Anemia in chronic kidney disease: Secondary | ICD-10-CM | POA: Diagnosis not present

## 2022-08-24 DIAGNOSIS — N2581 Secondary hyperparathyroidism of renal origin: Secondary | ICD-10-CM | POA: Diagnosis not present

## 2022-08-24 DIAGNOSIS — I10 Essential (primary) hypertension: Secondary | ICD-10-CM | POA: Diagnosis not present

## 2022-08-24 DIAGNOSIS — N186 End stage renal disease: Secondary | ICD-10-CM | POA: Diagnosis not present

## 2022-08-24 DIAGNOSIS — D509 Iron deficiency anemia, unspecified: Secondary | ICD-10-CM | POA: Diagnosis not present

## 2022-08-24 DIAGNOSIS — D631 Anemia in chronic kidney disease: Secondary | ICD-10-CM | POA: Diagnosis not present

## 2022-08-27 DIAGNOSIS — N186 End stage renal disease: Secondary | ICD-10-CM | POA: Diagnosis not present

## 2022-08-27 DIAGNOSIS — D509 Iron deficiency anemia, unspecified: Secondary | ICD-10-CM | POA: Diagnosis not present

## 2022-08-27 DIAGNOSIS — I10 Essential (primary) hypertension: Secondary | ICD-10-CM | POA: Diagnosis not present

## 2022-08-27 DIAGNOSIS — N2581 Secondary hyperparathyroidism of renal origin: Secondary | ICD-10-CM | POA: Diagnosis not present

## 2022-08-27 DIAGNOSIS — D631 Anemia in chronic kidney disease: Secondary | ICD-10-CM | POA: Diagnosis not present

## 2022-08-29 DIAGNOSIS — D631 Anemia in chronic kidney disease: Secondary | ICD-10-CM | POA: Diagnosis not present

## 2022-08-29 DIAGNOSIS — I10 Essential (primary) hypertension: Secondary | ICD-10-CM | POA: Diagnosis not present

## 2022-08-29 DIAGNOSIS — D509 Iron deficiency anemia, unspecified: Secondary | ICD-10-CM | POA: Diagnosis not present

## 2022-08-29 DIAGNOSIS — Z09 Encounter for follow-up examination after completed treatment for conditions other than malignant neoplasm: Secondary | ICD-10-CM | POA: Diagnosis not present

## 2022-08-29 DIAGNOSIS — N2581 Secondary hyperparathyroidism of renal origin: Secondary | ICD-10-CM | POA: Diagnosis not present

## 2022-08-29 DIAGNOSIS — N186 End stage renal disease: Secondary | ICD-10-CM | POA: Diagnosis not present

## 2022-08-31 DIAGNOSIS — D509 Iron deficiency anemia, unspecified: Secondary | ICD-10-CM | POA: Diagnosis not present

## 2022-08-31 DIAGNOSIS — N2581 Secondary hyperparathyroidism of renal origin: Secondary | ICD-10-CM | POA: Diagnosis not present

## 2022-08-31 DIAGNOSIS — I10 Essential (primary) hypertension: Secondary | ICD-10-CM | POA: Diagnosis not present

## 2022-08-31 DIAGNOSIS — D631 Anemia in chronic kidney disease: Secondary | ICD-10-CM | POA: Diagnosis not present

## 2022-08-31 DIAGNOSIS — N186 End stage renal disease: Secondary | ICD-10-CM | POA: Diagnosis not present

## 2022-09-03 DIAGNOSIS — N2581 Secondary hyperparathyroidism of renal origin: Secondary | ICD-10-CM | POA: Diagnosis not present

## 2022-09-03 DIAGNOSIS — D631 Anemia in chronic kidney disease: Secondary | ICD-10-CM | POA: Diagnosis not present

## 2022-09-03 DIAGNOSIS — N186 End stage renal disease: Secondary | ICD-10-CM | POA: Diagnosis not present

## 2022-09-03 DIAGNOSIS — D509 Iron deficiency anemia, unspecified: Secondary | ICD-10-CM | POA: Diagnosis not present

## 2022-09-04 DIAGNOSIS — Z940 Kidney transplant status: Secondary | ICD-10-CM | POA: Diagnosis not present

## 2022-09-05 DIAGNOSIS — N2581 Secondary hyperparathyroidism of renal origin: Secondary | ICD-10-CM | POA: Diagnosis not present

## 2022-09-05 DIAGNOSIS — D631 Anemia in chronic kidney disease: Secondary | ICD-10-CM | POA: Diagnosis not present

## 2022-09-05 DIAGNOSIS — N186 End stage renal disease: Secondary | ICD-10-CM | POA: Diagnosis not present

## 2022-09-05 DIAGNOSIS — D509 Iron deficiency anemia, unspecified: Secondary | ICD-10-CM | POA: Diagnosis not present

## 2022-09-07 DIAGNOSIS — N2581 Secondary hyperparathyroidism of renal origin: Secondary | ICD-10-CM | POA: Diagnosis not present

## 2022-09-07 DIAGNOSIS — D631 Anemia in chronic kidney disease: Secondary | ICD-10-CM | POA: Diagnosis not present

## 2022-09-07 DIAGNOSIS — D509 Iron deficiency anemia, unspecified: Secondary | ICD-10-CM | POA: Diagnosis not present

## 2022-09-07 DIAGNOSIS — N186 End stage renal disease: Secondary | ICD-10-CM | POA: Diagnosis not present

## 2022-09-10 DIAGNOSIS — D509 Iron deficiency anemia, unspecified: Secondary | ICD-10-CM | POA: Diagnosis not present

## 2022-09-10 DIAGNOSIS — N186 End stage renal disease: Secondary | ICD-10-CM | POA: Diagnosis not present

## 2022-09-10 DIAGNOSIS — N2581 Secondary hyperparathyroidism of renal origin: Secondary | ICD-10-CM | POA: Diagnosis not present

## 2022-09-10 DIAGNOSIS — D631 Anemia in chronic kidney disease: Secondary | ICD-10-CM | POA: Diagnosis not present

## 2022-09-12 DIAGNOSIS — N186 End stage renal disease: Secondary | ICD-10-CM | POA: Diagnosis not present

## 2022-09-12 DIAGNOSIS — N2581 Secondary hyperparathyroidism of renal origin: Secondary | ICD-10-CM | POA: Diagnosis not present

## 2022-09-12 DIAGNOSIS — D631 Anemia in chronic kidney disease: Secondary | ICD-10-CM | POA: Diagnosis not present

## 2022-09-12 DIAGNOSIS — D509 Iron deficiency anemia, unspecified: Secondary | ICD-10-CM | POA: Diagnosis not present

## 2022-09-14 DIAGNOSIS — N186 End stage renal disease: Secondary | ICD-10-CM | POA: Diagnosis not present

## 2022-09-14 DIAGNOSIS — D509 Iron deficiency anemia, unspecified: Secondary | ICD-10-CM | POA: Diagnosis not present

## 2022-09-14 DIAGNOSIS — N2581 Secondary hyperparathyroidism of renal origin: Secondary | ICD-10-CM | POA: Diagnosis not present

## 2022-09-14 DIAGNOSIS — D631 Anemia in chronic kidney disease: Secondary | ICD-10-CM | POA: Diagnosis not present

## 2022-09-17 DIAGNOSIS — N186 End stage renal disease: Secondary | ICD-10-CM | POA: Diagnosis not present

## 2022-09-17 DIAGNOSIS — D509 Iron deficiency anemia, unspecified: Secondary | ICD-10-CM | POA: Diagnosis not present

## 2022-09-17 DIAGNOSIS — D631 Anemia in chronic kidney disease: Secondary | ICD-10-CM | POA: Diagnosis not present

## 2022-09-17 DIAGNOSIS — N2581 Secondary hyperparathyroidism of renal origin: Secondary | ICD-10-CM | POA: Diagnosis not present

## 2022-09-19 DIAGNOSIS — D509 Iron deficiency anemia, unspecified: Secondary | ICD-10-CM | POA: Diagnosis not present

## 2022-09-19 DIAGNOSIS — N2581 Secondary hyperparathyroidism of renal origin: Secondary | ICD-10-CM | POA: Diagnosis not present

## 2022-09-19 DIAGNOSIS — N186 End stage renal disease: Secondary | ICD-10-CM | POA: Diagnosis not present

## 2022-09-19 DIAGNOSIS — D631 Anemia in chronic kidney disease: Secondary | ICD-10-CM | POA: Diagnosis not present

## 2022-09-21 DIAGNOSIS — N186 End stage renal disease: Secondary | ICD-10-CM | POA: Diagnosis not present

## 2022-09-21 DIAGNOSIS — D631 Anemia in chronic kidney disease: Secondary | ICD-10-CM | POA: Diagnosis not present

## 2022-09-21 DIAGNOSIS — N2581 Secondary hyperparathyroidism of renal origin: Secondary | ICD-10-CM | POA: Diagnosis not present

## 2022-09-21 DIAGNOSIS — D509 Iron deficiency anemia, unspecified: Secondary | ICD-10-CM | POA: Diagnosis not present

## 2022-09-24 DIAGNOSIS — N2581 Secondary hyperparathyroidism of renal origin: Secondary | ICD-10-CM | POA: Diagnosis not present

## 2022-09-24 DIAGNOSIS — D631 Anemia in chronic kidney disease: Secondary | ICD-10-CM | POA: Diagnosis not present

## 2022-09-24 DIAGNOSIS — D509 Iron deficiency anemia, unspecified: Secondary | ICD-10-CM | POA: Diagnosis not present

## 2022-09-24 DIAGNOSIS — N186 End stage renal disease: Secondary | ICD-10-CM | POA: Diagnosis not present

## 2022-09-26 DIAGNOSIS — D509 Iron deficiency anemia, unspecified: Secondary | ICD-10-CM | POA: Diagnosis not present

## 2022-09-26 DIAGNOSIS — N2581 Secondary hyperparathyroidism of renal origin: Secondary | ICD-10-CM | POA: Diagnosis not present

## 2022-09-26 DIAGNOSIS — N186 End stage renal disease: Secondary | ICD-10-CM | POA: Diagnosis not present

## 2022-09-26 DIAGNOSIS — D631 Anemia in chronic kidney disease: Secondary | ICD-10-CM | POA: Diagnosis not present

## 2022-09-28 DIAGNOSIS — D631 Anemia in chronic kidney disease: Secondary | ICD-10-CM | POA: Diagnosis not present

## 2022-09-28 DIAGNOSIS — D509 Iron deficiency anemia, unspecified: Secondary | ICD-10-CM | POA: Diagnosis not present

## 2022-09-28 DIAGNOSIS — N186 End stage renal disease: Secondary | ICD-10-CM | POA: Diagnosis not present

## 2022-09-28 DIAGNOSIS — N2581 Secondary hyperparathyroidism of renal origin: Secondary | ICD-10-CM | POA: Diagnosis not present

## 2022-10-01 DIAGNOSIS — D631 Anemia in chronic kidney disease: Secondary | ICD-10-CM | POA: Diagnosis not present

## 2022-10-01 DIAGNOSIS — Z23 Encounter for immunization: Secondary | ICD-10-CM | POA: Diagnosis not present

## 2022-10-01 DIAGNOSIS — I10 Essential (primary) hypertension: Secondary | ICD-10-CM | POA: Diagnosis not present

## 2022-10-01 DIAGNOSIS — D509 Iron deficiency anemia, unspecified: Secondary | ICD-10-CM | POA: Diagnosis not present

## 2022-10-01 DIAGNOSIS — N186 End stage renal disease: Secondary | ICD-10-CM | POA: Diagnosis not present

## 2022-10-01 DIAGNOSIS — N2581 Secondary hyperparathyroidism of renal origin: Secondary | ICD-10-CM | POA: Diagnosis not present

## 2022-10-03 DIAGNOSIS — N186 End stage renal disease: Secondary | ICD-10-CM | POA: Diagnosis not present

## 2022-10-03 DIAGNOSIS — D509 Iron deficiency anemia, unspecified: Secondary | ICD-10-CM | POA: Diagnosis not present

## 2022-10-03 DIAGNOSIS — N2581 Secondary hyperparathyroidism of renal origin: Secondary | ICD-10-CM | POA: Diagnosis not present

## 2022-10-03 DIAGNOSIS — Z23 Encounter for immunization: Secondary | ICD-10-CM | POA: Diagnosis not present

## 2022-10-05 DIAGNOSIS — N186 End stage renal disease: Secondary | ICD-10-CM | POA: Diagnosis not present

## 2022-10-05 DIAGNOSIS — Z23 Encounter for immunization: Secondary | ICD-10-CM | POA: Diagnosis not present

## 2022-10-05 DIAGNOSIS — N2581 Secondary hyperparathyroidism of renal origin: Secondary | ICD-10-CM | POA: Diagnosis not present

## 2022-10-05 DIAGNOSIS — D509 Iron deficiency anemia, unspecified: Secondary | ICD-10-CM | POA: Diagnosis not present

## 2022-10-08 DIAGNOSIS — N2581 Secondary hyperparathyroidism of renal origin: Secondary | ICD-10-CM | POA: Diagnosis not present

## 2022-10-08 DIAGNOSIS — D509 Iron deficiency anemia, unspecified: Secondary | ICD-10-CM | POA: Diagnosis not present

## 2022-10-08 DIAGNOSIS — Z23 Encounter for immunization: Secondary | ICD-10-CM | POA: Diagnosis not present

## 2022-10-08 DIAGNOSIS — N186 End stage renal disease: Secondary | ICD-10-CM | POA: Diagnosis not present

## 2022-10-10 DIAGNOSIS — D509 Iron deficiency anemia, unspecified: Secondary | ICD-10-CM | POA: Diagnosis not present

## 2022-10-10 DIAGNOSIS — Z23 Encounter for immunization: Secondary | ICD-10-CM | POA: Diagnosis not present

## 2022-10-10 DIAGNOSIS — N186 End stage renal disease: Secondary | ICD-10-CM | POA: Diagnosis not present

## 2022-10-10 DIAGNOSIS — N2581 Secondary hyperparathyroidism of renal origin: Secondary | ICD-10-CM | POA: Diagnosis not present

## 2022-10-12 DIAGNOSIS — Z23 Encounter for immunization: Secondary | ICD-10-CM | POA: Diagnosis not present

## 2022-10-12 DIAGNOSIS — N186 End stage renal disease: Secondary | ICD-10-CM | POA: Diagnosis not present

## 2022-10-12 DIAGNOSIS — N2581 Secondary hyperparathyroidism of renal origin: Secondary | ICD-10-CM | POA: Diagnosis not present

## 2022-10-12 DIAGNOSIS — D509 Iron deficiency anemia, unspecified: Secondary | ICD-10-CM | POA: Diagnosis not present

## 2022-10-15 DIAGNOSIS — D509 Iron deficiency anemia, unspecified: Secondary | ICD-10-CM | POA: Diagnosis not present

## 2022-10-15 DIAGNOSIS — N2581 Secondary hyperparathyroidism of renal origin: Secondary | ICD-10-CM | POA: Diagnosis not present

## 2022-10-15 DIAGNOSIS — N186 End stage renal disease: Secondary | ICD-10-CM | POA: Diagnosis not present

## 2022-10-15 DIAGNOSIS — Z23 Encounter for immunization: Secondary | ICD-10-CM | POA: Diagnosis not present

## 2022-10-17 DIAGNOSIS — N2581 Secondary hyperparathyroidism of renal origin: Secondary | ICD-10-CM | POA: Diagnosis not present

## 2022-10-17 DIAGNOSIS — N186 End stage renal disease: Secondary | ICD-10-CM | POA: Diagnosis not present

## 2022-10-17 DIAGNOSIS — D509 Iron deficiency anemia, unspecified: Secondary | ICD-10-CM | POA: Diagnosis not present

## 2022-10-17 DIAGNOSIS — Z23 Encounter for immunization: Secondary | ICD-10-CM | POA: Diagnosis not present

## 2022-10-19 DIAGNOSIS — N2581 Secondary hyperparathyroidism of renal origin: Secondary | ICD-10-CM | POA: Diagnosis not present

## 2022-10-19 DIAGNOSIS — D509 Iron deficiency anemia, unspecified: Secondary | ICD-10-CM | POA: Diagnosis not present

## 2022-10-19 DIAGNOSIS — Z23 Encounter for immunization: Secondary | ICD-10-CM | POA: Diagnosis not present

## 2022-10-19 DIAGNOSIS — N186 End stage renal disease: Secondary | ICD-10-CM | POA: Diagnosis not present

## 2022-10-22 DIAGNOSIS — N186 End stage renal disease: Secondary | ICD-10-CM | POA: Diagnosis not present

## 2022-10-22 DIAGNOSIS — Z23 Encounter for immunization: Secondary | ICD-10-CM | POA: Diagnosis not present

## 2022-10-22 DIAGNOSIS — N2581 Secondary hyperparathyroidism of renal origin: Secondary | ICD-10-CM | POA: Diagnosis not present

## 2022-10-22 DIAGNOSIS — D509 Iron deficiency anemia, unspecified: Secondary | ICD-10-CM | POA: Diagnosis not present

## 2022-10-24 DIAGNOSIS — D509 Iron deficiency anemia, unspecified: Secondary | ICD-10-CM | POA: Diagnosis not present

## 2022-10-24 DIAGNOSIS — N2581 Secondary hyperparathyroidism of renal origin: Secondary | ICD-10-CM | POA: Diagnosis not present

## 2022-10-24 DIAGNOSIS — Z23 Encounter for immunization: Secondary | ICD-10-CM | POA: Diagnosis not present

## 2022-10-24 DIAGNOSIS — N186 End stage renal disease: Secondary | ICD-10-CM | POA: Diagnosis not present

## 2022-10-24 DIAGNOSIS — Z01818 Encounter for other preprocedural examination: Secondary | ICD-10-CM | POA: Diagnosis not present

## 2022-10-26 DIAGNOSIS — N2581 Secondary hyperparathyroidism of renal origin: Secondary | ICD-10-CM | POA: Diagnosis not present

## 2022-10-26 DIAGNOSIS — D509 Iron deficiency anemia, unspecified: Secondary | ICD-10-CM | POA: Diagnosis not present

## 2022-10-26 DIAGNOSIS — N186 End stage renal disease: Secondary | ICD-10-CM | POA: Diagnosis not present

## 2022-10-26 DIAGNOSIS — Z23 Encounter for immunization: Secondary | ICD-10-CM | POA: Diagnosis not present

## 2022-10-29 DIAGNOSIS — N2581 Secondary hyperparathyroidism of renal origin: Secondary | ICD-10-CM | POA: Diagnosis not present

## 2022-10-29 DIAGNOSIS — N186 End stage renal disease: Secondary | ICD-10-CM | POA: Diagnosis not present

## 2022-10-29 DIAGNOSIS — Z23 Encounter for immunization: Secondary | ICD-10-CM | POA: Diagnosis not present

## 2022-10-29 DIAGNOSIS — D509 Iron deficiency anemia, unspecified: Secondary | ICD-10-CM | POA: Diagnosis not present

## 2022-10-31 DIAGNOSIS — Z23 Encounter for immunization: Secondary | ICD-10-CM | POA: Diagnosis not present

## 2022-10-31 DIAGNOSIS — N2581 Secondary hyperparathyroidism of renal origin: Secondary | ICD-10-CM | POA: Diagnosis not present

## 2022-10-31 DIAGNOSIS — N186 End stage renal disease: Secondary | ICD-10-CM | POA: Diagnosis not present

## 2022-10-31 DIAGNOSIS — D509 Iron deficiency anemia, unspecified: Secondary | ICD-10-CM | POA: Diagnosis not present

## 2022-10-31 DIAGNOSIS — A630 Anogenital (venereal) warts: Secondary | ICD-10-CM | POA: Diagnosis not present

## 2022-11-01 DIAGNOSIS — N186 End stage renal disease: Secondary | ICD-10-CM | POA: Diagnosis not present

## 2022-11-01 DIAGNOSIS — D631 Anemia in chronic kidney disease: Secondary | ICD-10-CM | POA: Diagnosis not present

## 2022-11-01 DIAGNOSIS — I10 Essential (primary) hypertension: Secondary | ICD-10-CM | POA: Diagnosis not present

## 2022-11-02 DIAGNOSIS — N2581 Secondary hyperparathyroidism of renal origin: Secondary | ICD-10-CM | POA: Diagnosis not present

## 2022-11-02 DIAGNOSIS — D509 Iron deficiency anemia, unspecified: Secondary | ICD-10-CM | POA: Diagnosis not present

## 2022-11-02 DIAGNOSIS — N186 End stage renal disease: Secondary | ICD-10-CM | POA: Diagnosis not present

## 2022-11-02 DIAGNOSIS — D631 Anemia in chronic kidney disease: Secondary | ICD-10-CM | POA: Diagnosis not present

## 2022-11-05 DIAGNOSIS — N186 End stage renal disease: Secondary | ICD-10-CM | POA: Diagnosis not present

## 2022-11-05 DIAGNOSIS — N2581 Secondary hyperparathyroidism of renal origin: Secondary | ICD-10-CM | POA: Diagnosis not present

## 2022-11-05 DIAGNOSIS — D631 Anemia in chronic kidney disease: Secondary | ICD-10-CM | POA: Diagnosis not present

## 2022-11-05 DIAGNOSIS — D509 Iron deficiency anemia, unspecified: Secondary | ICD-10-CM | POA: Diagnosis not present

## 2022-11-07 DIAGNOSIS — N186 End stage renal disease: Secondary | ICD-10-CM | POA: Diagnosis not present

## 2022-11-07 DIAGNOSIS — D631 Anemia in chronic kidney disease: Secondary | ICD-10-CM | POA: Diagnosis not present

## 2022-11-07 DIAGNOSIS — D509 Iron deficiency anemia, unspecified: Secondary | ICD-10-CM | POA: Diagnosis not present

## 2022-11-07 DIAGNOSIS — N2581 Secondary hyperparathyroidism of renal origin: Secondary | ICD-10-CM | POA: Diagnosis not present

## 2022-11-09 DIAGNOSIS — N186 End stage renal disease: Secondary | ICD-10-CM | POA: Diagnosis not present

## 2022-11-09 DIAGNOSIS — N2581 Secondary hyperparathyroidism of renal origin: Secondary | ICD-10-CM | POA: Diagnosis not present

## 2022-11-09 DIAGNOSIS — D509 Iron deficiency anemia, unspecified: Secondary | ICD-10-CM | POA: Diagnosis not present

## 2022-11-09 DIAGNOSIS — D631 Anemia in chronic kidney disease: Secondary | ICD-10-CM | POA: Diagnosis not present

## 2022-11-12 DIAGNOSIS — D631 Anemia in chronic kidney disease: Secondary | ICD-10-CM | POA: Diagnosis not present

## 2022-11-12 DIAGNOSIS — D509 Iron deficiency anemia, unspecified: Secondary | ICD-10-CM | POA: Diagnosis not present

## 2022-11-12 DIAGNOSIS — N186 End stage renal disease: Secondary | ICD-10-CM | POA: Diagnosis not present

## 2022-11-12 DIAGNOSIS — N2581 Secondary hyperparathyroidism of renal origin: Secondary | ICD-10-CM | POA: Diagnosis not present

## 2022-11-14 DIAGNOSIS — N2581 Secondary hyperparathyroidism of renal origin: Secondary | ICD-10-CM | POA: Diagnosis not present

## 2022-11-14 DIAGNOSIS — D631 Anemia in chronic kidney disease: Secondary | ICD-10-CM | POA: Diagnosis not present

## 2022-11-14 DIAGNOSIS — N186 End stage renal disease: Secondary | ICD-10-CM | POA: Diagnosis not present

## 2022-11-14 DIAGNOSIS — D509 Iron deficiency anemia, unspecified: Secondary | ICD-10-CM | POA: Diagnosis not present

## 2022-11-16 DIAGNOSIS — N2581 Secondary hyperparathyroidism of renal origin: Secondary | ICD-10-CM | POA: Diagnosis not present

## 2022-11-16 DIAGNOSIS — D631 Anemia in chronic kidney disease: Secondary | ICD-10-CM | POA: Diagnosis not present

## 2022-11-16 DIAGNOSIS — D509 Iron deficiency anemia, unspecified: Secondary | ICD-10-CM | POA: Diagnosis not present

## 2022-11-16 DIAGNOSIS — N186 End stage renal disease: Secondary | ICD-10-CM | POA: Diagnosis not present

## 2022-11-19 DIAGNOSIS — D509 Iron deficiency anemia, unspecified: Secondary | ICD-10-CM | POA: Diagnosis not present

## 2022-11-19 DIAGNOSIS — N2581 Secondary hyperparathyroidism of renal origin: Secondary | ICD-10-CM | POA: Diagnosis not present

## 2022-11-19 DIAGNOSIS — D631 Anemia in chronic kidney disease: Secondary | ICD-10-CM | POA: Diagnosis not present

## 2022-11-19 DIAGNOSIS — N186 End stage renal disease: Secondary | ICD-10-CM | POA: Diagnosis not present

## 2022-11-21 DIAGNOSIS — D631 Anemia in chronic kidney disease: Secondary | ICD-10-CM | POA: Diagnosis not present

## 2022-11-21 DIAGNOSIS — D509 Iron deficiency anemia, unspecified: Secondary | ICD-10-CM | POA: Diagnosis not present

## 2022-11-21 DIAGNOSIS — N186 End stage renal disease: Secondary | ICD-10-CM | POA: Diagnosis not present

## 2022-11-21 DIAGNOSIS — N2581 Secondary hyperparathyroidism of renal origin: Secondary | ICD-10-CM | POA: Diagnosis not present

## 2022-11-23 DIAGNOSIS — N2581 Secondary hyperparathyroidism of renal origin: Secondary | ICD-10-CM | POA: Diagnosis not present

## 2022-11-23 DIAGNOSIS — D631 Anemia in chronic kidney disease: Secondary | ICD-10-CM | POA: Diagnosis not present

## 2022-11-23 DIAGNOSIS — D509 Iron deficiency anemia, unspecified: Secondary | ICD-10-CM | POA: Diagnosis not present

## 2022-11-23 DIAGNOSIS — N186 End stage renal disease: Secondary | ICD-10-CM | POA: Diagnosis not present

## 2022-11-26 DIAGNOSIS — D509 Iron deficiency anemia, unspecified: Secondary | ICD-10-CM | POA: Diagnosis not present

## 2022-11-26 DIAGNOSIS — N186 End stage renal disease: Secondary | ICD-10-CM | POA: Diagnosis not present

## 2022-11-26 DIAGNOSIS — N2581 Secondary hyperparathyroidism of renal origin: Secondary | ICD-10-CM | POA: Diagnosis not present

## 2022-11-26 DIAGNOSIS — D631 Anemia in chronic kidney disease: Secondary | ICD-10-CM | POA: Diagnosis not present

## 2022-11-28 DIAGNOSIS — D509 Iron deficiency anemia, unspecified: Secondary | ICD-10-CM | POA: Diagnosis not present

## 2022-11-28 DIAGNOSIS — D631 Anemia in chronic kidney disease: Secondary | ICD-10-CM | POA: Diagnosis not present

## 2022-11-28 DIAGNOSIS — N2581 Secondary hyperparathyroidism of renal origin: Secondary | ICD-10-CM | POA: Diagnosis not present

## 2022-11-28 DIAGNOSIS — N186 End stage renal disease: Secondary | ICD-10-CM | POA: Diagnosis not present

## 2022-11-30 DIAGNOSIS — N186 End stage renal disease: Secondary | ICD-10-CM | POA: Diagnosis not present

## 2022-11-30 DIAGNOSIS — D631 Anemia in chronic kidney disease: Secondary | ICD-10-CM | POA: Diagnosis not present

## 2022-11-30 DIAGNOSIS — D509 Iron deficiency anemia, unspecified: Secondary | ICD-10-CM | POA: Diagnosis not present

## 2022-11-30 DIAGNOSIS — N2581 Secondary hyperparathyroidism of renal origin: Secondary | ICD-10-CM | POA: Diagnosis not present

## 2022-12-02 DIAGNOSIS — D631 Anemia in chronic kidney disease: Secondary | ICD-10-CM | POA: Diagnosis not present

## 2022-12-02 DIAGNOSIS — I10 Essential (primary) hypertension: Secondary | ICD-10-CM | POA: Diagnosis not present

## 2022-12-02 DIAGNOSIS — N186 End stage renal disease: Secondary | ICD-10-CM | POA: Diagnosis not present

## 2022-12-03 DIAGNOSIS — D631 Anemia in chronic kidney disease: Secondary | ICD-10-CM | POA: Diagnosis not present

## 2022-12-03 DIAGNOSIS — Z23 Encounter for immunization: Secondary | ICD-10-CM | POA: Diagnosis not present

## 2022-12-03 DIAGNOSIS — N186 End stage renal disease: Secondary | ICD-10-CM | POA: Diagnosis not present

## 2022-12-03 DIAGNOSIS — N2581 Secondary hyperparathyroidism of renal origin: Secondary | ICD-10-CM | POA: Diagnosis not present

## 2022-12-03 DIAGNOSIS — D509 Iron deficiency anemia, unspecified: Secondary | ICD-10-CM | POA: Diagnosis not present

## 2022-12-05 DIAGNOSIS — Z23 Encounter for immunization: Secondary | ICD-10-CM | POA: Diagnosis not present

## 2022-12-05 DIAGNOSIS — D509 Iron deficiency anemia, unspecified: Secondary | ICD-10-CM | POA: Diagnosis not present

## 2022-12-05 DIAGNOSIS — N186 End stage renal disease: Secondary | ICD-10-CM | POA: Diagnosis not present

## 2022-12-05 DIAGNOSIS — D631 Anemia in chronic kidney disease: Secondary | ICD-10-CM | POA: Diagnosis not present

## 2022-12-05 DIAGNOSIS — N2581 Secondary hyperparathyroidism of renal origin: Secondary | ICD-10-CM | POA: Diagnosis not present

## 2022-12-06 DIAGNOSIS — B078 Other viral warts: Secondary | ICD-10-CM | POA: Diagnosis not present

## 2022-12-06 DIAGNOSIS — A630 Anogenital (venereal) warts: Secondary | ICD-10-CM | POA: Diagnosis not present

## 2022-12-07 DIAGNOSIS — D509 Iron deficiency anemia, unspecified: Secondary | ICD-10-CM | POA: Diagnosis not present

## 2022-12-07 DIAGNOSIS — N186 End stage renal disease: Secondary | ICD-10-CM | POA: Diagnosis not present

## 2022-12-07 DIAGNOSIS — D631 Anemia in chronic kidney disease: Secondary | ICD-10-CM | POA: Diagnosis not present

## 2022-12-07 DIAGNOSIS — Z23 Encounter for immunization: Secondary | ICD-10-CM | POA: Diagnosis not present

## 2022-12-07 DIAGNOSIS — N2581 Secondary hyperparathyroidism of renal origin: Secondary | ICD-10-CM | POA: Diagnosis not present

## 2022-12-10 DIAGNOSIS — D509 Iron deficiency anemia, unspecified: Secondary | ICD-10-CM | POA: Diagnosis not present

## 2022-12-10 DIAGNOSIS — Z23 Encounter for immunization: Secondary | ICD-10-CM | POA: Diagnosis not present

## 2022-12-10 DIAGNOSIS — N186 End stage renal disease: Secondary | ICD-10-CM | POA: Diagnosis not present

## 2022-12-10 DIAGNOSIS — N2581 Secondary hyperparathyroidism of renal origin: Secondary | ICD-10-CM | POA: Diagnosis not present

## 2022-12-10 DIAGNOSIS — D631 Anemia in chronic kidney disease: Secondary | ICD-10-CM | POA: Diagnosis not present

## 2022-12-12 DIAGNOSIS — N186 End stage renal disease: Secondary | ICD-10-CM | POA: Diagnosis not present

## 2022-12-12 DIAGNOSIS — D631 Anemia in chronic kidney disease: Secondary | ICD-10-CM | POA: Diagnosis not present

## 2022-12-12 DIAGNOSIS — Z23 Encounter for immunization: Secondary | ICD-10-CM | POA: Diagnosis not present

## 2022-12-12 DIAGNOSIS — D509 Iron deficiency anemia, unspecified: Secondary | ICD-10-CM | POA: Diagnosis not present

## 2022-12-12 DIAGNOSIS — N2581 Secondary hyperparathyroidism of renal origin: Secondary | ICD-10-CM | POA: Diagnosis not present

## 2022-12-14 DIAGNOSIS — N2581 Secondary hyperparathyroidism of renal origin: Secondary | ICD-10-CM | POA: Diagnosis not present

## 2022-12-14 DIAGNOSIS — N186 End stage renal disease: Secondary | ICD-10-CM | POA: Diagnosis not present

## 2022-12-14 DIAGNOSIS — D631 Anemia in chronic kidney disease: Secondary | ICD-10-CM | POA: Diagnosis not present

## 2022-12-14 DIAGNOSIS — Z23 Encounter for immunization: Secondary | ICD-10-CM | POA: Diagnosis not present

## 2022-12-14 DIAGNOSIS — D509 Iron deficiency anemia, unspecified: Secondary | ICD-10-CM | POA: Diagnosis not present

## 2022-12-17 DIAGNOSIS — D631 Anemia in chronic kidney disease: Secondary | ICD-10-CM | POA: Diagnosis not present

## 2022-12-17 DIAGNOSIS — N186 End stage renal disease: Secondary | ICD-10-CM | POA: Diagnosis not present

## 2022-12-17 DIAGNOSIS — Z23 Encounter for immunization: Secondary | ICD-10-CM | POA: Diagnosis not present

## 2022-12-17 DIAGNOSIS — N2581 Secondary hyperparathyroidism of renal origin: Secondary | ICD-10-CM | POA: Diagnosis not present

## 2022-12-17 DIAGNOSIS — D509 Iron deficiency anemia, unspecified: Secondary | ICD-10-CM | POA: Diagnosis not present

## 2022-12-19 DIAGNOSIS — Z09 Encounter for follow-up examination after completed treatment for conditions other than malignant neoplasm: Secondary | ICD-10-CM | POA: Diagnosis not present

## 2022-12-19 DIAGNOSIS — N2581 Secondary hyperparathyroidism of renal origin: Secondary | ICD-10-CM | POA: Diagnosis not present

## 2022-12-19 DIAGNOSIS — D509 Iron deficiency anemia, unspecified: Secondary | ICD-10-CM | POA: Diagnosis not present

## 2022-12-19 DIAGNOSIS — N186 End stage renal disease: Secondary | ICD-10-CM | POA: Diagnosis not present

## 2022-12-19 DIAGNOSIS — Z23 Encounter for immunization: Secondary | ICD-10-CM | POA: Diagnosis not present

## 2022-12-19 DIAGNOSIS — D631 Anemia in chronic kidney disease: Secondary | ICD-10-CM | POA: Diagnosis not present

## 2022-12-21 DIAGNOSIS — N186 End stage renal disease: Secondary | ICD-10-CM | POA: Diagnosis not present

## 2022-12-21 DIAGNOSIS — D631 Anemia in chronic kidney disease: Secondary | ICD-10-CM | POA: Diagnosis not present

## 2022-12-21 DIAGNOSIS — N2581 Secondary hyperparathyroidism of renal origin: Secondary | ICD-10-CM | POA: Diagnosis not present

## 2022-12-21 DIAGNOSIS — D509 Iron deficiency anemia, unspecified: Secondary | ICD-10-CM | POA: Diagnosis not present

## 2022-12-21 DIAGNOSIS — Z23 Encounter for immunization: Secondary | ICD-10-CM | POA: Diagnosis not present

## 2022-12-24 DIAGNOSIS — N2581 Secondary hyperparathyroidism of renal origin: Secondary | ICD-10-CM | POA: Diagnosis not present

## 2022-12-24 DIAGNOSIS — N186 End stage renal disease: Secondary | ICD-10-CM | POA: Diagnosis not present

## 2022-12-24 DIAGNOSIS — D509 Iron deficiency anemia, unspecified: Secondary | ICD-10-CM | POA: Diagnosis not present

## 2022-12-24 DIAGNOSIS — D631 Anemia in chronic kidney disease: Secondary | ICD-10-CM | POA: Diagnosis not present

## 2022-12-24 DIAGNOSIS — Z23 Encounter for immunization: Secondary | ICD-10-CM | POA: Diagnosis not present

## 2022-12-26 DIAGNOSIS — N2581 Secondary hyperparathyroidism of renal origin: Secondary | ICD-10-CM | POA: Diagnosis not present

## 2022-12-26 DIAGNOSIS — N186 End stage renal disease: Secondary | ICD-10-CM | POA: Diagnosis not present

## 2022-12-26 DIAGNOSIS — Z23 Encounter for immunization: Secondary | ICD-10-CM | POA: Diagnosis not present

## 2022-12-26 DIAGNOSIS — D631 Anemia in chronic kidney disease: Secondary | ICD-10-CM | POA: Diagnosis not present

## 2022-12-26 DIAGNOSIS — D509 Iron deficiency anemia, unspecified: Secondary | ICD-10-CM | POA: Diagnosis not present

## 2022-12-28 DIAGNOSIS — D631 Anemia in chronic kidney disease: Secondary | ICD-10-CM | POA: Diagnosis not present

## 2022-12-28 DIAGNOSIS — N186 End stage renal disease: Secondary | ICD-10-CM | POA: Diagnosis not present

## 2022-12-28 DIAGNOSIS — Z23 Encounter for immunization: Secondary | ICD-10-CM | POA: Diagnosis not present

## 2022-12-28 DIAGNOSIS — D509 Iron deficiency anemia, unspecified: Secondary | ICD-10-CM | POA: Diagnosis not present

## 2022-12-28 DIAGNOSIS — N2581 Secondary hyperparathyroidism of renal origin: Secondary | ICD-10-CM | POA: Diagnosis not present

## 2022-12-31 DIAGNOSIS — N186 End stage renal disease: Secondary | ICD-10-CM | POA: Diagnosis not present

## 2022-12-31 DIAGNOSIS — D509 Iron deficiency anemia, unspecified: Secondary | ICD-10-CM | POA: Diagnosis not present

## 2022-12-31 DIAGNOSIS — Z23 Encounter for immunization: Secondary | ICD-10-CM | POA: Diagnosis not present

## 2022-12-31 DIAGNOSIS — D631 Anemia in chronic kidney disease: Secondary | ICD-10-CM | POA: Diagnosis not present

## 2022-12-31 DIAGNOSIS — N2581 Secondary hyperparathyroidism of renal origin: Secondary | ICD-10-CM | POA: Diagnosis not present

## 2023-01-02 DIAGNOSIS — N186 End stage renal disease: Secondary | ICD-10-CM | POA: Diagnosis not present

## 2023-01-02 DIAGNOSIS — D509 Iron deficiency anemia, unspecified: Secondary | ICD-10-CM | POA: Diagnosis not present

## 2023-01-02 DIAGNOSIS — D631 Anemia in chronic kidney disease: Secondary | ICD-10-CM | POA: Diagnosis not present

## 2023-01-02 DIAGNOSIS — N2581 Secondary hyperparathyroidism of renal origin: Secondary | ICD-10-CM | POA: Diagnosis not present

## 2023-01-03 DIAGNOSIS — Z01818 Encounter for other preprocedural examination: Secondary | ICD-10-CM | POA: Diagnosis not present

## 2023-01-04 DIAGNOSIS — N186 End stage renal disease: Secondary | ICD-10-CM | POA: Diagnosis not present

## 2023-01-04 DIAGNOSIS — D509 Iron deficiency anemia, unspecified: Secondary | ICD-10-CM | POA: Diagnosis not present

## 2023-01-04 DIAGNOSIS — N2581 Secondary hyperparathyroidism of renal origin: Secondary | ICD-10-CM | POA: Diagnosis not present

## 2023-01-04 DIAGNOSIS — D631 Anemia in chronic kidney disease: Secondary | ICD-10-CM | POA: Diagnosis not present

## 2023-01-07 DIAGNOSIS — N2581 Secondary hyperparathyroidism of renal origin: Secondary | ICD-10-CM | POA: Diagnosis not present

## 2023-01-07 DIAGNOSIS — N186 End stage renal disease: Secondary | ICD-10-CM | POA: Diagnosis not present

## 2023-01-07 DIAGNOSIS — D631 Anemia in chronic kidney disease: Secondary | ICD-10-CM | POA: Diagnosis not present

## 2023-01-07 DIAGNOSIS — D509 Iron deficiency anemia, unspecified: Secondary | ICD-10-CM | POA: Diagnosis not present

## 2023-01-09 DIAGNOSIS — N2581 Secondary hyperparathyroidism of renal origin: Secondary | ICD-10-CM | POA: Diagnosis not present

## 2023-01-09 DIAGNOSIS — D509 Iron deficiency anemia, unspecified: Secondary | ICD-10-CM | POA: Diagnosis not present

## 2023-01-09 DIAGNOSIS — N186 End stage renal disease: Secondary | ICD-10-CM | POA: Diagnosis not present

## 2023-01-09 DIAGNOSIS — D631 Anemia in chronic kidney disease: Secondary | ICD-10-CM | POA: Diagnosis not present

## 2023-01-11 DIAGNOSIS — N186 End stage renal disease: Secondary | ICD-10-CM | POA: Diagnosis not present

## 2023-01-11 DIAGNOSIS — D631 Anemia in chronic kidney disease: Secondary | ICD-10-CM | POA: Diagnosis not present

## 2023-01-11 DIAGNOSIS — N2581 Secondary hyperparathyroidism of renal origin: Secondary | ICD-10-CM | POA: Diagnosis not present

## 2023-01-11 DIAGNOSIS — D509 Iron deficiency anemia, unspecified: Secondary | ICD-10-CM | POA: Diagnosis not present

## 2023-01-14 DIAGNOSIS — N186 End stage renal disease: Secondary | ICD-10-CM | POA: Diagnosis not present

## 2023-01-14 DIAGNOSIS — N2581 Secondary hyperparathyroidism of renal origin: Secondary | ICD-10-CM | POA: Diagnosis not present

## 2023-01-14 DIAGNOSIS — D631 Anemia in chronic kidney disease: Secondary | ICD-10-CM | POA: Diagnosis not present

## 2023-01-14 DIAGNOSIS — D509 Iron deficiency anemia, unspecified: Secondary | ICD-10-CM | POA: Diagnosis not present

## 2023-01-16 DIAGNOSIS — N186 End stage renal disease: Secondary | ICD-10-CM | POA: Diagnosis not present

## 2023-01-16 DIAGNOSIS — D509 Iron deficiency anemia, unspecified: Secondary | ICD-10-CM | POA: Diagnosis not present

## 2023-01-16 DIAGNOSIS — D631 Anemia in chronic kidney disease: Secondary | ICD-10-CM | POA: Diagnosis not present

## 2023-01-16 DIAGNOSIS — N2581 Secondary hyperparathyroidism of renal origin: Secondary | ICD-10-CM | POA: Diagnosis not present

## 2023-01-18 DIAGNOSIS — N186 End stage renal disease: Secondary | ICD-10-CM | POA: Diagnosis not present

## 2023-01-18 DIAGNOSIS — D509 Iron deficiency anemia, unspecified: Secondary | ICD-10-CM | POA: Diagnosis not present

## 2023-01-18 DIAGNOSIS — N2581 Secondary hyperparathyroidism of renal origin: Secondary | ICD-10-CM | POA: Diagnosis not present

## 2023-01-18 DIAGNOSIS — D631 Anemia in chronic kidney disease: Secondary | ICD-10-CM | POA: Diagnosis not present

## 2023-01-21 DIAGNOSIS — N186 End stage renal disease: Secondary | ICD-10-CM | POA: Diagnosis not present

## 2023-01-21 DIAGNOSIS — N2581 Secondary hyperparathyroidism of renal origin: Secondary | ICD-10-CM | POA: Diagnosis not present

## 2023-01-21 DIAGNOSIS — D631 Anemia in chronic kidney disease: Secondary | ICD-10-CM | POA: Diagnosis not present

## 2023-01-21 DIAGNOSIS — D509 Iron deficiency anemia, unspecified: Secondary | ICD-10-CM | POA: Diagnosis not present

## 2023-01-23 DIAGNOSIS — D631 Anemia in chronic kidney disease: Secondary | ICD-10-CM | POA: Diagnosis not present

## 2023-01-23 DIAGNOSIS — D509 Iron deficiency anemia, unspecified: Secondary | ICD-10-CM | POA: Diagnosis not present

## 2023-01-23 DIAGNOSIS — N2581 Secondary hyperparathyroidism of renal origin: Secondary | ICD-10-CM | POA: Diagnosis not present

## 2023-01-23 DIAGNOSIS — N186 End stage renal disease: Secondary | ICD-10-CM | POA: Diagnosis not present

## 2023-01-25 DIAGNOSIS — N2581 Secondary hyperparathyroidism of renal origin: Secondary | ICD-10-CM | POA: Diagnosis not present

## 2023-01-25 DIAGNOSIS — N186 End stage renal disease: Secondary | ICD-10-CM | POA: Diagnosis not present

## 2023-01-25 DIAGNOSIS — D509 Iron deficiency anemia, unspecified: Secondary | ICD-10-CM | POA: Diagnosis not present

## 2023-01-25 DIAGNOSIS — D631 Anemia in chronic kidney disease: Secondary | ICD-10-CM | POA: Diagnosis not present

## 2023-01-28 DIAGNOSIS — D509 Iron deficiency anemia, unspecified: Secondary | ICD-10-CM | POA: Diagnosis not present

## 2023-01-28 DIAGNOSIS — D631 Anemia in chronic kidney disease: Secondary | ICD-10-CM | POA: Diagnosis not present

## 2023-01-28 DIAGNOSIS — N2581 Secondary hyperparathyroidism of renal origin: Secondary | ICD-10-CM | POA: Diagnosis not present

## 2023-01-28 DIAGNOSIS — N186 End stage renal disease: Secondary | ICD-10-CM | POA: Diagnosis not present

## 2023-01-30 DIAGNOSIS — D509 Iron deficiency anemia, unspecified: Secondary | ICD-10-CM | POA: Diagnosis not present

## 2023-01-30 DIAGNOSIS — N186 End stage renal disease: Secondary | ICD-10-CM | POA: Diagnosis not present

## 2023-01-30 DIAGNOSIS — N2581 Secondary hyperparathyroidism of renal origin: Secondary | ICD-10-CM | POA: Diagnosis not present

## 2023-01-30 DIAGNOSIS — D631 Anemia in chronic kidney disease: Secondary | ICD-10-CM | POA: Diagnosis not present

## 2023-01-31 DIAGNOSIS — N186 End stage renal disease: Secondary | ICD-10-CM | POA: Diagnosis not present

## 2023-01-31 DIAGNOSIS — Z992 Dependence on renal dialysis: Secondary | ICD-10-CM | POA: Diagnosis not present

## 2023-02-01 DIAGNOSIS — D509 Iron deficiency anemia, unspecified: Secondary | ICD-10-CM | POA: Diagnosis not present

## 2023-02-01 DIAGNOSIS — N186 End stage renal disease: Secondary | ICD-10-CM | POA: Diagnosis not present

## 2023-02-01 DIAGNOSIS — D631 Anemia in chronic kidney disease: Secondary | ICD-10-CM | POA: Diagnosis not present

## 2023-02-01 DIAGNOSIS — N2581 Secondary hyperparathyroidism of renal origin: Secondary | ICD-10-CM | POA: Diagnosis not present

## 2023-02-04 DIAGNOSIS — D631 Anemia in chronic kidney disease: Secondary | ICD-10-CM | POA: Diagnosis not present

## 2023-02-04 DIAGNOSIS — N186 End stage renal disease: Secondary | ICD-10-CM | POA: Diagnosis not present

## 2023-02-04 DIAGNOSIS — N2581 Secondary hyperparathyroidism of renal origin: Secondary | ICD-10-CM | POA: Diagnosis not present

## 2023-02-04 DIAGNOSIS — D509 Iron deficiency anemia, unspecified: Secondary | ICD-10-CM | POA: Diagnosis not present

## 2023-02-06 DIAGNOSIS — D631 Anemia in chronic kidney disease: Secondary | ICD-10-CM | POA: Diagnosis not present

## 2023-02-06 DIAGNOSIS — N186 End stage renal disease: Secondary | ICD-10-CM | POA: Diagnosis not present

## 2023-02-06 DIAGNOSIS — D509 Iron deficiency anemia, unspecified: Secondary | ICD-10-CM | POA: Diagnosis not present

## 2023-02-06 DIAGNOSIS — N2581 Secondary hyperparathyroidism of renal origin: Secondary | ICD-10-CM | POA: Diagnosis not present

## 2023-02-08 DIAGNOSIS — N186 End stage renal disease: Secondary | ICD-10-CM | POA: Diagnosis not present

## 2023-02-08 DIAGNOSIS — D631 Anemia in chronic kidney disease: Secondary | ICD-10-CM | POA: Diagnosis not present

## 2023-02-08 DIAGNOSIS — D509 Iron deficiency anemia, unspecified: Secondary | ICD-10-CM | POA: Diagnosis not present

## 2023-02-08 DIAGNOSIS — N2581 Secondary hyperparathyroidism of renal origin: Secondary | ICD-10-CM | POA: Diagnosis not present

## 2023-02-11 DIAGNOSIS — Z7682 Awaiting organ transplant status: Secondary | ICD-10-CM | POA: Diagnosis not present

## 2023-02-11 DIAGNOSIS — N2581 Secondary hyperparathyroidism of renal origin: Secondary | ICD-10-CM | POA: Diagnosis not present

## 2023-02-11 DIAGNOSIS — D631 Anemia in chronic kidney disease: Secondary | ICD-10-CM | POA: Diagnosis not present

## 2023-02-11 DIAGNOSIS — N186 End stage renal disease: Secondary | ICD-10-CM | POA: Diagnosis not present

## 2023-02-11 DIAGNOSIS — D509 Iron deficiency anemia, unspecified: Secondary | ICD-10-CM | POA: Diagnosis not present

## 2023-02-13 DIAGNOSIS — D509 Iron deficiency anemia, unspecified: Secondary | ICD-10-CM | POA: Diagnosis not present

## 2023-02-13 DIAGNOSIS — D631 Anemia in chronic kidney disease: Secondary | ICD-10-CM | POA: Diagnosis not present

## 2023-02-13 DIAGNOSIS — N2581 Secondary hyperparathyroidism of renal origin: Secondary | ICD-10-CM | POA: Diagnosis not present

## 2023-02-13 DIAGNOSIS — N186 End stage renal disease: Secondary | ICD-10-CM | POA: Diagnosis not present

## 2023-02-15 DIAGNOSIS — N186 End stage renal disease: Secondary | ICD-10-CM | POA: Diagnosis not present

## 2023-02-15 DIAGNOSIS — N2581 Secondary hyperparathyroidism of renal origin: Secondary | ICD-10-CM | POA: Diagnosis not present

## 2023-02-15 DIAGNOSIS — D631 Anemia in chronic kidney disease: Secondary | ICD-10-CM | POA: Diagnosis not present

## 2023-02-15 DIAGNOSIS — D509 Iron deficiency anemia, unspecified: Secondary | ICD-10-CM | POA: Diagnosis not present

## 2023-02-18 DIAGNOSIS — N2581 Secondary hyperparathyroidism of renal origin: Secondary | ICD-10-CM | POA: Diagnosis not present

## 2023-02-18 DIAGNOSIS — D509 Iron deficiency anemia, unspecified: Secondary | ICD-10-CM | POA: Diagnosis not present

## 2023-02-18 DIAGNOSIS — D631 Anemia in chronic kidney disease: Secondary | ICD-10-CM | POA: Diagnosis not present

## 2023-02-18 DIAGNOSIS — N186 End stage renal disease: Secondary | ICD-10-CM | POA: Diagnosis not present

## 2023-02-20 DIAGNOSIS — N2581 Secondary hyperparathyroidism of renal origin: Secondary | ICD-10-CM | POA: Diagnosis not present

## 2023-02-20 DIAGNOSIS — D631 Anemia in chronic kidney disease: Secondary | ICD-10-CM | POA: Diagnosis not present

## 2023-02-20 DIAGNOSIS — D509 Iron deficiency anemia, unspecified: Secondary | ICD-10-CM | POA: Diagnosis not present

## 2023-02-20 DIAGNOSIS — N186 End stage renal disease: Secondary | ICD-10-CM | POA: Diagnosis not present

## 2023-02-22 DIAGNOSIS — N2581 Secondary hyperparathyroidism of renal origin: Secondary | ICD-10-CM | POA: Diagnosis not present

## 2023-02-22 DIAGNOSIS — N186 End stage renal disease: Secondary | ICD-10-CM | POA: Diagnosis not present

## 2023-02-22 DIAGNOSIS — D509 Iron deficiency anemia, unspecified: Secondary | ICD-10-CM | POA: Diagnosis not present

## 2023-02-22 DIAGNOSIS — D631 Anemia in chronic kidney disease: Secondary | ICD-10-CM | POA: Diagnosis not present

## 2023-02-24 DIAGNOSIS — D631 Anemia in chronic kidney disease: Secondary | ICD-10-CM | POA: Diagnosis not present

## 2023-02-24 DIAGNOSIS — D509 Iron deficiency anemia, unspecified: Secondary | ICD-10-CM | POA: Diagnosis not present

## 2023-02-24 DIAGNOSIS — N2581 Secondary hyperparathyroidism of renal origin: Secondary | ICD-10-CM | POA: Diagnosis not present

## 2023-02-24 DIAGNOSIS — N186 End stage renal disease: Secondary | ICD-10-CM | POA: Diagnosis not present

## 2023-02-26 DIAGNOSIS — N2581 Secondary hyperparathyroidism of renal origin: Secondary | ICD-10-CM | POA: Diagnosis not present

## 2023-02-26 DIAGNOSIS — D631 Anemia in chronic kidney disease: Secondary | ICD-10-CM | POA: Diagnosis not present

## 2023-02-26 DIAGNOSIS — N186 End stage renal disease: Secondary | ICD-10-CM | POA: Diagnosis not present

## 2023-02-26 DIAGNOSIS — D509 Iron deficiency anemia, unspecified: Secondary | ICD-10-CM | POA: Diagnosis not present

## 2023-03-01 DIAGNOSIS — N2581 Secondary hyperparathyroidism of renal origin: Secondary | ICD-10-CM | POA: Diagnosis not present

## 2023-03-01 DIAGNOSIS — D631 Anemia in chronic kidney disease: Secondary | ICD-10-CM | POA: Diagnosis not present

## 2023-03-01 DIAGNOSIS — N186 End stage renal disease: Secondary | ICD-10-CM | POA: Diagnosis not present

## 2023-03-01 DIAGNOSIS — D509 Iron deficiency anemia, unspecified: Secondary | ICD-10-CM | POA: Diagnosis not present

## 2023-03-02 DIAGNOSIS — Z992 Dependence on renal dialysis: Secondary | ICD-10-CM | POA: Diagnosis not present

## 2023-03-02 DIAGNOSIS — N186 End stage renal disease: Secondary | ICD-10-CM | POA: Diagnosis not present

## 2023-03-04 DIAGNOSIS — D509 Iron deficiency anemia, unspecified: Secondary | ICD-10-CM | POA: Diagnosis not present

## 2023-03-04 DIAGNOSIS — D631 Anemia in chronic kidney disease: Secondary | ICD-10-CM | POA: Diagnosis not present

## 2023-03-04 DIAGNOSIS — N186 End stage renal disease: Secondary | ICD-10-CM | POA: Diagnosis not present

## 2023-03-04 DIAGNOSIS — N2581 Secondary hyperparathyroidism of renal origin: Secondary | ICD-10-CM | POA: Diagnosis not present

## 2023-03-08 DIAGNOSIS — D631 Anemia in chronic kidney disease: Secondary | ICD-10-CM | POA: Diagnosis not present

## 2023-03-08 DIAGNOSIS — N186 End stage renal disease: Secondary | ICD-10-CM | POA: Diagnosis not present

## 2023-03-08 DIAGNOSIS — D509 Iron deficiency anemia, unspecified: Secondary | ICD-10-CM | POA: Diagnosis not present

## 2023-03-08 DIAGNOSIS — N2581 Secondary hyperparathyroidism of renal origin: Secondary | ICD-10-CM | POA: Diagnosis not present

## 2023-03-11 DIAGNOSIS — D631 Anemia in chronic kidney disease: Secondary | ICD-10-CM | POA: Diagnosis not present

## 2023-03-11 DIAGNOSIS — D509 Iron deficiency anemia, unspecified: Secondary | ICD-10-CM | POA: Diagnosis not present

## 2023-03-11 DIAGNOSIS — N186 End stage renal disease: Secondary | ICD-10-CM | POA: Diagnosis not present

## 2023-03-11 DIAGNOSIS — N2581 Secondary hyperparathyroidism of renal origin: Secondary | ICD-10-CM | POA: Diagnosis not present

## 2023-03-13 DIAGNOSIS — D509 Iron deficiency anemia, unspecified: Secondary | ICD-10-CM | POA: Diagnosis not present

## 2023-03-13 DIAGNOSIS — N2581 Secondary hyperparathyroidism of renal origin: Secondary | ICD-10-CM | POA: Diagnosis not present

## 2023-03-13 DIAGNOSIS — D631 Anemia in chronic kidney disease: Secondary | ICD-10-CM | POA: Diagnosis not present

## 2023-03-13 DIAGNOSIS — N186 End stage renal disease: Secondary | ICD-10-CM | POA: Diagnosis not present

## 2023-03-14 DIAGNOSIS — Z0100 Encounter for examination of eyes and vision without abnormal findings: Secondary | ICD-10-CM | POA: Diagnosis not present

## 2023-03-15 DIAGNOSIS — N186 End stage renal disease: Secondary | ICD-10-CM | POA: Diagnosis not present

## 2023-03-15 DIAGNOSIS — D631 Anemia in chronic kidney disease: Secondary | ICD-10-CM | POA: Diagnosis not present

## 2023-03-15 DIAGNOSIS — N2581 Secondary hyperparathyroidism of renal origin: Secondary | ICD-10-CM | POA: Diagnosis not present

## 2023-03-15 DIAGNOSIS — D509 Iron deficiency anemia, unspecified: Secondary | ICD-10-CM | POA: Diagnosis not present

## 2023-03-18 DIAGNOSIS — D509 Iron deficiency anemia, unspecified: Secondary | ICD-10-CM | POA: Diagnosis not present

## 2023-03-18 DIAGNOSIS — N186 End stage renal disease: Secondary | ICD-10-CM | POA: Diagnosis not present

## 2023-03-18 DIAGNOSIS — N2581 Secondary hyperparathyroidism of renal origin: Secondary | ICD-10-CM | POA: Diagnosis not present

## 2023-03-18 DIAGNOSIS — D631 Anemia in chronic kidney disease: Secondary | ICD-10-CM | POA: Diagnosis not present

## 2023-03-20 DIAGNOSIS — D631 Anemia in chronic kidney disease: Secondary | ICD-10-CM | POA: Diagnosis not present

## 2023-03-20 DIAGNOSIS — N2581 Secondary hyperparathyroidism of renal origin: Secondary | ICD-10-CM | POA: Diagnosis not present

## 2023-03-20 DIAGNOSIS — N186 End stage renal disease: Secondary | ICD-10-CM | POA: Diagnosis not present

## 2023-03-20 DIAGNOSIS — D509 Iron deficiency anemia, unspecified: Secondary | ICD-10-CM | POA: Diagnosis not present

## 2023-03-20 DIAGNOSIS — A630 Anogenital (venereal) warts: Secondary | ICD-10-CM | POA: Diagnosis not present

## 2023-03-22 DIAGNOSIS — D631 Anemia in chronic kidney disease: Secondary | ICD-10-CM | POA: Diagnosis not present

## 2023-03-22 DIAGNOSIS — D509 Iron deficiency anemia, unspecified: Secondary | ICD-10-CM | POA: Diagnosis not present

## 2023-03-22 DIAGNOSIS — N2581 Secondary hyperparathyroidism of renal origin: Secondary | ICD-10-CM | POA: Diagnosis not present

## 2023-03-22 DIAGNOSIS — N186 End stage renal disease: Secondary | ICD-10-CM | POA: Diagnosis not present

## 2023-03-25 DIAGNOSIS — N186 End stage renal disease: Secondary | ICD-10-CM | POA: Diagnosis not present

## 2023-03-25 DIAGNOSIS — D509 Iron deficiency anemia, unspecified: Secondary | ICD-10-CM | POA: Diagnosis not present

## 2023-03-25 DIAGNOSIS — D631 Anemia in chronic kidney disease: Secondary | ICD-10-CM | POA: Diagnosis not present

## 2023-03-25 DIAGNOSIS — N2581 Secondary hyperparathyroidism of renal origin: Secondary | ICD-10-CM | POA: Diagnosis not present

## 2023-03-28 DIAGNOSIS — N2581 Secondary hyperparathyroidism of renal origin: Secondary | ICD-10-CM | POA: Diagnosis not present

## 2023-03-28 DIAGNOSIS — D509 Iron deficiency anemia, unspecified: Secondary | ICD-10-CM | POA: Diagnosis not present

## 2023-03-28 DIAGNOSIS — D631 Anemia in chronic kidney disease: Secondary | ICD-10-CM | POA: Diagnosis not present

## 2023-03-28 DIAGNOSIS — N186 End stage renal disease: Secondary | ICD-10-CM | POA: Diagnosis not present

## 2023-03-29 DIAGNOSIS — N186 End stage renal disease: Secondary | ICD-10-CM | POA: Diagnosis not present

## 2023-03-29 DIAGNOSIS — D509 Iron deficiency anemia, unspecified: Secondary | ICD-10-CM | POA: Diagnosis not present

## 2023-03-29 DIAGNOSIS — D631 Anemia in chronic kidney disease: Secondary | ICD-10-CM | POA: Diagnosis not present

## 2023-03-29 DIAGNOSIS — N2581 Secondary hyperparathyroidism of renal origin: Secondary | ICD-10-CM | POA: Diagnosis not present

## 2023-03-31 DIAGNOSIS — N186 End stage renal disease: Secondary | ICD-10-CM | POA: Diagnosis not present

## 2023-03-31 DIAGNOSIS — D631 Anemia in chronic kidney disease: Secondary | ICD-10-CM | POA: Diagnosis not present

## 2023-03-31 DIAGNOSIS — N2581 Secondary hyperparathyroidism of renal origin: Secondary | ICD-10-CM | POA: Diagnosis not present

## 2023-03-31 DIAGNOSIS — D509 Iron deficiency anemia, unspecified: Secondary | ICD-10-CM | POA: Diagnosis not present

## 2023-04-02 DIAGNOSIS — Z992 Dependence on renal dialysis: Secondary | ICD-10-CM | POA: Diagnosis not present

## 2023-04-02 DIAGNOSIS — N186 End stage renal disease: Secondary | ICD-10-CM | POA: Diagnosis not present

## 2023-04-05 DIAGNOSIS — N2581 Secondary hyperparathyroidism of renal origin: Secondary | ICD-10-CM | POA: Diagnosis not present

## 2023-04-05 DIAGNOSIS — N186 End stage renal disease: Secondary | ICD-10-CM | POA: Diagnosis not present

## 2023-04-05 DIAGNOSIS — E785 Hyperlipidemia, unspecified: Secondary | ICD-10-CM | POA: Diagnosis not present

## 2023-04-05 DIAGNOSIS — D631 Anemia in chronic kidney disease: Secondary | ICD-10-CM | POA: Diagnosis not present

## 2023-04-05 DIAGNOSIS — D509 Iron deficiency anemia, unspecified: Secondary | ICD-10-CM | POA: Diagnosis not present

## 2023-04-08 DIAGNOSIS — E785 Hyperlipidemia, unspecified: Secondary | ICD-10-CM | POA: Diagnosis not present

## 2023-04-08 DIAGNOSIS — D509 Iron deficiency anemia, unspecified: Secondary | ICD-10-CM | POA: Diagnosis not present

## 2023-04-08 DIAGNOSIS — N2581 Secondary hyperparathyroidism of renal origin: Secondary | ICD-10-CM | POA: Diagnosis not present

## 2023-04-08 DIAGNOSIS — D631 Anemia in chronic kidney disease: Secondary | ICD-10-CM | POA: Diagnosis not present

## 2023-04-08 DIAGNOSIS — N186 End stage renal disease: Secondary | ICD-10-CM | POA: Diagnosis not present

## 2023-04-10 DIAGNOSIS — N186 End stage renal disease: Secondary | ICD-10-CM | POA: Diagnosis not present

## 2023-04-10 DIAGNOSIS — E785 Hyperlipidemia, unspecified: Secondary | ICD-10-CM | POA: Diagnosis not present

## 2023-04-10 DIAGNOSIS — D631 Anemia in chronic kidney disease: Secondary | ICD-10-CM | POA: Diagnosis not present

## 2023-04-10 DIAGNOSIS — E7431 Sucrase-isomaltase deficiency: Secondary | ICD-10-CM | POA: Diagnosis not present

## 2023-04-10 DIAGNOSIS — D509 Iron deficiency anemia, unspecified: Secondary | ICD-10-CM | POA: Diagnosis not present

## 2023-04-10 DIAGNOSIS — N2581 Secondary hyperparathyroidism of renal origin: Secondary | ICD-10-CM | POA: Diagnosis not present

## 2023-04-11 DIAGNOSIS — Z7682 Awaiting organ transplant status: Secondary | ICD-10-CM | POA: Diagnosis not present

## 2023-04-12 DIAGNOSIS — N186 End stage renal disease: Secondary | ICD-10-CM | POA: Diagnosis not present

## 2023-04-12 DIAGNOSIS — N2581 Secondary hyperparathyroidism of renal origin: Secondary | ICD-10-CM | POA: Diagnosis not present

## 2023-04-12 DIAGNOSIS — D509 Iron deficiency anemia, unspecified: Secondary | ICD-10-CM | POA: Diagnosis not present

## 2023-04-12 DIAGNOSIS — E785 Hyperlipidemia, unspecified: Secondary | ICD-10-CM | POA: Diagnosis not present

## 2023-04-12 DIAGNOSIS — D631 Anemia in chronic kidney disease: Secondary | ICD-10-CM | POA: Diagnosis not present

## 2023-04-17 DIAGNOSIS — D509 Iron deficiency anemia, unspecified: Secondary | ICD-10-CM | POA: Diagnosis not present

## 2023-04-17 DIAGNOSIS — N186 End stage renal disease: Secondary | ICD-10-CM | POA: Diagnosis not present

## 2023-04-17 DIAGNOSIS — E785 Hyperlipidemia, unspecified: Secondary | ICD-10-CM | POA: Diagnosis not present

## 2023-04-17 DIAGNOSIS — D631 Anemia in chronic kidney disease: Secondary | ICD-10-CM | POA: Diagnosis not present

## 2023-04-17 DIAGNOSIS — N2581 Secondary hyperparathyroidism of renal origin: Secondary | ICD-10-CM | POA: Diagnosis not present

## 2023-04-19 DIAGNOSIS — N186 End stage renal disease: Secondary | ICD-10-CM | POA: Diagnosis not present

## 2023-04-19 DIAGNOSIS — D509 Iron deficiency anemia, unspecified: Secondary | ICD-10-CM | POA: Diagnosis not present

## 2023-04-19 DIAGNOSIS — N2581 Secondary hyperparathyroidism of renal origin: Secondary | ICD-10-CM | POA: Diagnosis not present

## 2023-04-19 DIAGNOSIS — D631 Anemia in chronic kidney disease: Secondary | ICD-10-CM | POA: Diagnosis not present

## 2023-04-19 DIAGNOSIS — E785 Hyperlipidemia, unspecified: Secondary | ICD-10-CM | POA: Diagnosis not present

## 2023-04-22 DIAGNOSIS — D509 Iron deficiency anemia, unspecified: Secondary | ICD-10-CM | POA: Diagnosis not present

## 2023-04-22 DIAGNOSIS — N2581 Secondary hyperparathyroidism of renal origin: Secondary | ICD-10-CM | POA: Diagnosis not present

## 2023-04-22 DIAGNOSIS — N186 End stage renal disease: Secondary | ICD-10-CM | POA: Diagnosis not present

## 2023-04-22 DIAGNOSIS — D631 Anemia in chronic kidney disease: Secondary | ICD-10-CM | POA: Diagnosis not present

## 2023-04-22 DIAGNOSIS — E785 Hyperlipidemia, unspecified: Secondary | ICD-10-CM | POA: Diagnosis not present

## 2023-04-24 DIAGNOSIS — N186 End stage renal disease: Secondary | ICD-10-CM | POA: Diagnosis not present

## 2023-04-24 DIAGNOSIS — D631 Anemia in chronic kidney disease: Secondary | ICD-10-CM | POA: Diagnosis not present

## 2023-04-24 DIAGNOSIS — N2581 Secondary hyperparathyroidism of renal origin: Secondary | ICD-10-CM | POA: Diagnosis not present

## 2023-04-24 DIAGNOSIS — E785 Hyperlipidemia, unspecified: Secondary | ICD-10-CM | POA: Diagnosis not present

## 2023-04-24 DIAGNOSIS — D509 Iron deficiency anemia, unspecified: Secondary | ICD-10-CM | POA: Diagnosis not present

## 2023-04-26 DIAGNOSIS — D631 Anemia in chronic kidney disease: Secondary | ICD-10-CM | POA: Diagnosis not present

## 2023-04-26 DIAGNOSIS — D509 Iron deficiency anemia, unspecified: Secondary | ICD-10-CM | POA: Diagnosis not present

## 2023-04-26 DIAGNOSIS — E785 Hyperlipidemia, unspecified: Secondary | ICD-10-CM | POA: Diagnosis not present

## 2023-04-26 DIAGNOSIS — N2581 Secondary hyperparathyroidism of renal origin: Secondary | ICD-10-CM | POA: Diagnosis not present

## 2023-04-26 DIAGNOSIS — N186 End stage renal disease: Secondary | ICD-10-CM | POA: Diagnosis not present

## 2023-04-29 DIAGNOSIS — N186 End stage renal disease: Secondary | ICD-10-CM | POA: Diagnosis not present

## 2023-04-29 DIAGNOSIS — E785 Hyperlipidemia, unspecified: Secondary | ICD-10-CM | POA: Diagnosis not present

## 2023-04-29 DIAGNOSIS — N2581 Secondary hyperparathyroidism of renal origin: Secondary | ICD-10-CM | POA: Diagnosis not present

## 2023-04-29 DIAGNOSIS — D509 Iron deficiency anemia, unspecified: Secondary | ICD-10-CM | POA: Diagnosis not present

## 2023-04-29 DIAGNOSIS — D631 Anemia in chronic kidney disease: Secondary | ICD-10-CM | POA: Diagnosis not present

## 2023-05-01 DIAGNOSIS — D509 Iron deficiency anemia, unspecified: Secondary | ICD-10-CM | POA: Diagnosis not present

## 2023-05-01 DIAGNOSIS — N2581 Secondary hyperparathyroidism of renal origin: Secondary | ICD-10-CM | POA: Diagnosis not present

## 2023-05-01 DIAGNOSIS — E785 Hyperlipidemia, unspecified: Secondary | ICD-10-CM | POA: Diagnosis not present

## 2023-05-01 DIAGNOSIS — D631 Anemia in chronic kidney disease: Secondary | ICD-10-CM | POA: Diagnosis not present

## 2023-05-01 DIAGNOSIS — N186 End stage renal disease: Secondary | ICD-10-CM | POA: Diagnosis not present

## 2023-05-03 DIAGNOSIS — Z992 Dependence on renal dialysis: Secondary | ICD-10-CM | POA: Diagnosis not present

## 2023-05-03 DIAGNOSIS — N2581 Secondary hyperparathyroidism of renal origin: Secondary | ICD-10-CM | POA: Diagnosis not present

## 2023-05-03 DIAGNOSIS — D509 Iron deficiency anemia, unspecified: Secondary | ICD-10-CM | POA: Diagnosis not present

## 2023-05-03 DIAGNOSIS — D631 Anemia in chronic kidney disease: Secondary | ICD-10-CM | POA: Diagnosis not present

## 2023-05-03 DIAGNOSIS — N186 End stage renal disease: Secondary | ICD-10-CM | POA: Diagnosis not present

## 2023-05-03 DIAGNOSIS — E785 Hyperlipidemia, unspecified: Secondary | ICD-10-CM | POA: Diagnosis not present

## 2023-05-06 DIAGNOSIS — N186 End stage renal disease: Secondary | ICD-10-CM | POA: Diagnosis not present

## 2023-05-06 DIAGNOSIS — D631 Anemia in chronic kidney disease: Secondary | ICD-10-CM | POA: Diagnosis not present

## 2023-05-06 DIAGNOSIS — N2581 Secondary hyperparathyroidism of renal origin: Secondary | ICD-10-CM | POA: Diagnosis not present

## 2023-05-06 DIAGNOSIS — D509 Iron deficiency anemia, unspecified: Secondary | ICD-10-CM | POA: Diagnosis not present

## 2023-05-08 DIAGNOSIS — D631 Anemia in chronic kidney disease: Secondary | ICD-10-CM | POA: Diagnosis not present

## 2023-05-08 DIAGNOSIS — D509 Iron deficiency anemia, unspecified: Secondary | ICD-10-CM | POA: Diagnosis not present

## 2023-05-08 DIAGNOSIS — N186 End stage renal disease: Secondary | ICD-10-CM | POA: Diagnosis not present

## 2023-05-08 DIAGNOSIS — N2581 Secondary hyperparathyroidism of renal origin: Secondary | ICD-10-CM | POA: Diagnosis not present

## 2023-05-08 DIAGNOSIS — E7431 Sucrase-isomaltase deficiency: Secondary | ICD-10-CM | POA: Diagnosis not present

## 2023-05-08 DIAGNOSIS — Z1159 Encounter for screening for other viral diseases: Secondary | ICD-10-CM | POA: Diagnosis not present

## 2023-05-10 DIAGNOSIS — N2581 Secondary hyperparathyroidism of renal origin: Secondary | ICD-10-CM | POA: Diagnosis not present

## 2023-05-10 DIAGNOSIS — D509 Iron deficiency anemia, unspecified: Secondary | ICD-10-CM | POA: Diagnosis not present

## 2023-05-10 DIAGNOSIS — D631 Anemia in chronic kidney disease: Secondary | ICD-10-CM | POA: Diagnosis not present

## 2023-05-10 DIAGNOSIS — N186 End stage renal disease: Secondary | ICD-10-CM | POA: Diagnosis not present

## 2023-05-13 DIAGNOSIS — D631 Anemia in chronic kidney disease: Secondary | ICD-10-CM | POA: Diagnosis not present

## 2023-05-13 DIAGNOSIS — D509 Iron deficiency anemia, unspecified: Secondary | ICD-10-CM | POA: Diagnosis not present

## 2023-05-13 DIAGNOSIS — N186 End stage renal disease: Secondary | ICD-10-CM | POA: Diagnosis not present

## 2023-05-13 DIAGNOSIS — N2581 Secondary hyperparathyroidism of renal origin: Secondary | ICD-10-CM | POA: Diagnosis not present

## 2023-05-15 DIAGNOSIS — D509 Iron deficiency anemia, unspecified: Secondary | ICD-10-CM | POA: Diagnosis not present

## 2023-05-15 DIAGNOSIS — N186 End stage renal disease: Secondary | ICD-10-CM | POA: Diagnosis not present

## 2023-05-15 DIAGNOSIS — N2581 Secondary hyperparathyroidism of renal origin: Secondary | ICD-10-CM | POA: Diagnosis not present

## 2023-05-15 DIAGNOSIS — D631 Anemia in chronic kidney disease: Secondary | ICD-10-CM | POA: Diagnosis not present

## 2023-05-17 DIAGNOSIS — N2581 Secondary hyperparathyroidism of renal origin: Secondary | ICD-10-CM | POA: Diagnosis not present

## 2023-05-17 DIAGNOSIS — D631 Anemia in chronic kidney disease: Secondary | ICD-10-CM | POA: Diagnosis not present

## 2023-05-17 DIAGNOSIS — N186 End stage renal disease: Secondary | ICD-10-CM | POA: Diagnosis not present

## 2023-05-17 DIAGNOSIS — D509 Iron deficiency anemia, unspecified: Secondary | ICD-10-CM | POA: Diagnosis not present

## 2023-05-20 DIAGNOSIS — N2581 Secondary hyperparathyroidism of renal origin: Secondary | ICD-10-CM | POA: Diagnosis not present

## 2023-05-20 DIAGNOSIS — D631 Anemia in chronic kidney disease: Secondary | ICD-10-CM | POA: Diagnosis not present

## 2023-05-20 DIAGNOSIS — D509 Iron deficiency anemia, unspecified: Secondary | ICD-10-CM | POA: Diagnosis not present

## 2023-05-20 DIAGNOSIS — N186 End stage renal disease: Secondary | ICD-10-CM | POA: Diagnosis not present

## 2023-05-20 DIAGNOSIS — M25522 Pain in left elbow: Secondary | ICD-10-CM | POA: Diagnosis not present

## 2023-05-21 DIAGNOSIS — D472 Monoclonal gammopathy: Secondary | ICD-10-CM | POA: Diagnosis not present

## 2023-05-22 DIAGNOSIS — D509 Iron deficiency anemia, unspecified: Secondary | ICD-10-CM | POA: Diagnosis not present

## 2023-05-22 DIAGNOSIS — N186 End stage renal disease: Secondary | ICD-10-CM | POA: Diagnosis not present

## 2023-05-22 DIAGNOSIS — N2581 Secondary hyperparathyroidism of renal origin: Secondary | ICD-10-CM | POA: Diagnosis not present

## 2023-05-22 DIAGNOSIS — D631 Anemia in chronic kidney disease: Secondary | ICD-10-CM | POA: Diagnosis not present

## 2023-05-23 DIAGNOSIS — Z949 Transplanted organ and tissue status, unspecified: Secondary | ICD-10-CM | POA: Diagnosis not present

## 2023-05-24 DIAGNOSIS — N189 Chronic kidney disease, unspecified: Secondary | ICD-10-CM | POA: Diagnosis not present

## 2023-05-24 DIAGNOSIS — N186 End stage renal disease: Secondary | ICD-10-CM | POA: Diagnosis not present

## 2023-05-24 DIAGNOSIS — Z01810 Encounter for preprocedural cardiovascular examination: Secondary | ICD-10-CM | POA: Diagnosis not present

## 2023-05-24 DIAGNOSIS — Z992 Dependence on renal dialysis: Secondary | ICD-10-CM | POA: Diagnosis not present

## 2023-05-27 DIAGNOSIS — N186 End stage renal disease: Secondary | ICD-10-CM | POA: Diagnosis not present

## 2023-05-27 DIAGNOSIS — D509 Iron deficiency anemia, unspecified: Secondary | ICD-10-CM | POA: Diagnosis not present

## 2023-05-27 DIAGNOSIS — D631 Anemia in chronic kidney disease: Secondary | ICD-10-CM | POA: Diagnosis not present

## 2023-05-27 DIAGNOSIS — N2581 Secondary hyperparathyroidism of renal origin: Secondary | ICD-10-CM | POA: Diagnosis not present

## 2023-05-29 DIAGNOSIS — N2581 Secondary hyperparathyroidism of renal origin: Secondary | ICD-10-CM | POA: Diagnosis not present

## 2023-05-29 DIAGNOSIS — D509 Iron deficiency anemia, unspecified: Secondary | ICD-10-CM | POA: Diagnosis not present

## 2023-05-29 DIAGNOSIS — N186 End stage renal disease: Secondary | ICD-10-CM | POA: Diagnosis not present

## 2023-05-29 DIAGNOSIS — D631 Anemia in chronic kidney disease: Secondary | ICD-10-CM | POA: Diagnosis not present

## 2023-05-30 DIAGNOSIS — N186 End stage renal disease: Secondary | ICD-10-CM | POA: Diagnosis not present

## 2023-05-30 DIAGNOSIS — M25422 Effusion, left elbow: Secondary | ICD-10-CM | POA: Diagnosis not present

## 2023-05-30 DIAGNOSIS — M7022 Olecranon bursitis, left elbow: Secondary | ICD-10-CM | POA: Diagnosis not present

## 2023-05-30 DIAGNOSIS — M7032 Other bursitis of elbow, left elbow: Secondary | ICD-10-CM | POA: Diagnosis not present

## 2023-05-30 DIAGNOSIS — M7021 Olecranon bursitis, right elbow: Secondary | ICD-10-CM | POA: Diagnosis not present

## 2023-05-30 DIAGNOSIS — Z992 Dependence on renal dialysis: Secondary | ICD-10-CM | POA: Diagnosis not present

## 2023-05-30 DIAGNOSIS — M71122 Other infective bursitis, left elbow: Secondary | ICD-10-CM | POA: Diagnosis not present

## 2023-05-31 DIAGNOSIS — N186 End stage renal disease: Secondary | ICD-10-CM | POA: Diagnosis not present

## 2023-05-31 DIAGNOSIS — Z992 Dependence on renal dialysis: Secondary | ICD-10-CM | POA: Diagnosis not present

## 2023-05-31 DIAGNOSIS — D631 Anemia in chronic kidney disease: Secondary | ICD-10-CM | POA: Diagnosis not present

## 2023-05-31 DIAGNOSIS — N2581 Secondary hyperparathyroidism of renal origin: Secondary | ICD-10-CM | POA: Diagnosis not present

## 2023-05-31 DIAGNOSIS — D509 Iron deficiency anemia, unspecified: Secondary | ICD-10-CM | POA: Diagnosis not present

## 2023-06-03 DIAGNOSIS — N186 End stage renal disease: Secondary | ICD-10-CM | POA: Diagnosis not present

## 2023-06-03 DIAGNOSIS — D509 Iron deficiency anemia, unspecified: Secondary | ICD-10-CM | POA: Diagnosis not present

## 2023-06-03 DIAGNOSIS — D631 Anemia in chronic kidney disease: Secondary | ICD-10-CM | POA: Diagnosis not present

## 2023-06-03 DIAGNOSIS — N2581 Secondary hyperparathyroidism of renal origin: Secondary | ICD-10-CM | POA: Diagnosis not present

## 2023-06-04 DIAGNOSIS — N186 End stage renal disease: Secondary | ICD-10-CM | POA: Diagnosis not present

## 2023-06-04 DIAGNOSIS — F1210 Cannabis abuse, uncomplicated: Secondary | ICD-10-CM | POA: Diagnosis not present

## 2023-06-04 DIAGNOSIS — I5032 Chronic diastolic (congestive) heart failure: Secondary | ICD-10-CM | POA: Diagnosis not present

## 2023-06-04 DIAGNOSIS — I110 Hypertensive heart disease with heart failure: Secondary | ICD-10-CM | POA: Diagnosis not present

## 2023-06-04 DIAGNOSIS — Z79899 Other long term (current) drug therapy: Secondary | ICD-10-CM | POA: Diagnosis not present

## 2023-06-04 DIAGNOSIS — Z940 Kidney transplant status: Secondary | ICD-10-CM | POA: Diagnosis not present

## 2023-06-04 DIAGNOSIS — M069 Rheumatoid arthritis, unspecified: Secondary | ICD-10-CM | POA: Diagnosis not present

## 2023-06-04 DIAGNOSIS — I509 Heart failure, unspecified: Secondary | ICD-10-CM | POA: Diagnosis not present

## 2023-06-04 DIAGNOSIS — I272 Pulmonary hypertension, unspecified: Secondary | ICD-10-CM | POA: Diagnosis not present

## 2023-06-05 DIAGNOSIS — N186 End stage renal disease: Secondary | ICD-10-CM | POA: Diagnosis not present

## 2023-06-05 DIAGNOSIS — N2581 Secondary hyperparathyroidism of renal origin: Secondary | ICD-10-CM | POA: Diagnosis not present

## 2023-06-05 DIAGNOSIS — D631 Anemia in chronic kidney disease: Secondary | ICD-10-CM | POA: Diagnosis not present

## 2023-06-05 DIAGNOSIS — D509 Iron deficiency anemia, unspecified: Secondary | ICD-10-CM | POA: Diagnosis not present

## 2023-06-05 DIAGNOSIS — Z114 Encounter for screening for human immunodeficiency virus [HIV]: Secondary | ICD-10-CM | POA: Diagnosis not present

## 2023-06-05 DIAGNOSIS — E7431 Sucrase-isomaltase deficiency: Secondary | ICD-10-CM | POA: Diagnosis not present

## 2023-06-07 DIAGNOSIS — D509 Iron deficiency anemia, unspecified: Secondary | ICD-10-CM | POA: Diagnosis not present

## 2023-06-07 DIAGNOSIS — N186 End stage renal disease: Secondary | ICD-10-CM | POA: Diagnosis not present

## 2023-06-07 DIAGNOSIS — D631 Anemia in chronic kidney disease: Secondary | ICD-10-CM | POA: Diagnosis not present

## 2023-06-07 DIAGNOSIS — N2581 Secondary hyperparathyroidism of renal origin: Secondary | ICD-10-CM | POA: Diagnosis not present

## 2023-06-10 DIAGNOSIS — D509 Iron deficiency anemia, unspecified: Secondary | ICD-10-CM | POA: Diagnosis not present

## 2023-06-10 DIAGNOSIS — D631 Anemia in chronic kidney disease: Secondary | ICD-10-CM | POA: Diagnosis not present

## 2023-06-10 DIAGNOSIS — N186 End stage renal disease: Secondary | ICD-10-CM | POA: Diagnosis not present

## 2023-06-10 DIAGNOSIS — N2581 Secondary hyperparathyroidism of renal origin: Secondary | ICD-10-CM | POA: Diagnosis not present

## 2023-06-12 DIAGNOSIS — D509 Iron deficiency anemia, unspecified: Secondary | ICD-10-CM | POA: Diagnosis not present

## 2023-06-12 DIAGNOSIS — D631 Anemia in chronic kidney disease: Secondary | ICD-10-CM | POA: Diagnosis not present

## 2023-06-12 DIAGNOSIS — N186 End stage renal disease: Secondary | ICD-10-CM | POA: Diagnosis not present

## 2023-06-12 DIAGNOSIS — N2581 Secondary hyperparathyroidism of renal origin: Secondary | ICD-10-CM | POA: Diagnosis not present

## 2023-06-14 DIAGNOSIS — D631 Anemia in chronic kidney disease: Secondary | ICD-10-CM | POA: Diagnosis not present

## 2023-06-14 DIAGNOSIS — N186 End stage renal disease: Secondary | ICD-10-CM | POA: Diagnosis not present

## 2023-06-14 DIAGNOSIS — D509 Iron deficiency anemia, unspecified: Secondary | ICD-10-CM | POA: Diagnosis not present

## 2023-06-14 DIAGNOSIS — N2581 Secondary hyperparathyroidism of renal origin: Secondary | ICD-10-CM | POA: Diagnosis not present

## 2023-06-17 DIAGNOSIS — D509 Iron deficiency anemia, unspecified: Secondary | ICD-10-CM | POA: Diagnosis not present

## 2023-06-17 DIAGNOSIS — N186 End stage renal disease: Secondary | ICD-10-CM | POA: Diagnosis not present

## 2023-06-17 DIAGNOSIS — N2581 Secondary hyperparathyroidism of renal origin: Secondary | ICD-10-CM | POA: Diagnosis not present

## 2023-06-17 DIAGNOSIS — D631 Anemia in chronic kidney disease: Secondary | ICD-10-CM | POA: Diagnosis not present

## 2023-06-19 DIAGNOSIS — D631 Anemia in chronic kidney disease: Secondary | ICD-10-CM | POA: Diagnosis not present

## 2023-06-19 DIAGNOSIS — D509 Iron deficiency anemia, unspecified: Secondary | ICD-10-CM | POA: Diagnosis not present

## 2023-06-19 DIAGNOSIS — N186 End stage renal disease: Secondary | ICD-10-CM | POA: Diagnosis not present

## 2023-06-19 DIAGNOSIS — N2581 Secondary hyperparathyroidism of renal origin: Secondary | ICD-10-CM | POA: Diagnosis not present

## 2023-06-21 DIAGNOSIS — D509 Iron deficiency anemia, unspecified: Secondary | ICD-10-CM | POA: Diagnosis not present

## 2023-06-21 DIAGNOSIS — N186 End stage renal disease: Secondary | ICD-10-CM | POA: Diagnosis not present

## 2023-06-21 DIAGNOSIS — D631 Anemia in chronic kidney disease: Secondary | ICD-10-CM | POA: Diagnosis not present

## 2023-06-21 DIAGNOSIS — N2581 Secondary hyperparathyroidism of renal origin: Secondary | ICD-10-CM | POA: Diagnosis not present

## 2023-06-24 DIAGNOSIS — D509 Iron deficiency anemia, unspecified: Secondary | ICD-10-CM | POA: Diagnosis not present

## 2023-06-24 DIAGNOSIS — N2581 Secondary hyperparathyroidism of renal origin: Secondary | ICD-10-CM | POA: Diagnosis not present

## 2023-06-24 DIAGNOSIS — D631 Anemia in chronic kidney disease: Secondary | ICD-10-CM | POA: Diagnosis not present

## 2023-06-24 DIAGNOSIS — N186 End stage renal disease: Secondary | ICD-10-CM | POA: Diagnosis not present

## 2023-06-25 DIAGNOSIS — M25521 Pain in right elbow: Secondary | ICD-10-CM | POA: Diagnosis not present

## 2023-06-25 DIAGNOSIS — L723 Sebaceous cyst: Secondary | ICD-10-CM | POA: Diagnosis not present

## 2023-06-25 DIAGNOSIS — Z0001 Encounter for general adult medical examination with abnormal findings: Secondary | ICD-10-CM | POA: Diagnosis not present

## 2023-06-25 DIAGNOSIS — I509 Heart failure, unspecified: Secondary | ICD-10-CM | POA: Diagnosis not present

## 2023-06-25 DIAGNOSIS — G3184 Mild cognitive impairment, so stated: Secondary | ICD-10-CM | POA: Diagnosis not present

## 2023-06-25 DIAGNOSIS — M069 Rheumatoid arthritis, unspecified: Secondary | ICD-10-CM | POA: Diagnosis not present

## 2023-06-25 DIAGNOSIS — H18413 Arcus senilis, bilateral: Secondary | ICD-10-CM | POA: Diagnosis not present

## 2023-06-25 DIAGNOSIS — I272 Pulmonary hypertension, unspecified: Secondary | ICD-10-CM | POA: Diagnosis not present

## 2023-06-25 DIAGNOSIS — I700 Atherosclerosis of aorta: Secondary | ICD-10-CM | POA: Diagnosis not present

## 2023-06-26 DIAGNOSIS — N2581 Secondary hyperparathyroidism of renal origin: Secondary | ICD-10-CM | POA: Diagnosis not present

## 2023-06-26 DIAGNOSIS — D631 Anemia in chronic kidney disease: Secondary | ICD-10-CM | POA: Diagnosis not present

## 2023-06-26 DIAGNOSIS — D509 Iron deficiency anemia, unspecified: Secondary | ICD-10-CM | POA: Diagnosis not present

## 2023-06-26 DIAGNOSIS — N186 End stage renal disease: Secondary | ICD-10-CM | POA: Diagnosis not present

## 2023-06-28 DIAGNOSIS — D631 Anemia in chronic kidney disease: Secondary | ICD-10-CM | POA: Diagnosis not present

## 2023-06-28 DIAGNOSIS — D509 Iron deficiency anemia, unspecified: Secondary | ICD-10-CM | POA: Diagnosis not present

## 2023-06-28 DIAGNOSIS — N2581 Secondary hyperparathyroidism of renal origin: Secondary | ICD-10-CM | POA: Diagnosis not present

## 2023-06-28 DIAGNOSIS — N186 End stage renal disease: Secondary | ICD-10-CM | POA: Diagnosis not present

## 2023-07-01 DIAGNOSIS — N2581 Secondary hyperparathyroidism of renal origin: Secondary | ICD-10-CM | POA: Diagnosis not present

## 2023-07-01 DIAGNOSIS — N186 End stage renal disease: Secondary | ICD-10-CM | POA: Diagnosis not present

## 2023-07-01 DIAGNOSIS — D631 Anemia in chronic kidney disease: Secondary | ICD-10-CM | POA: Diagnosis not present

## 2023-07-01 DIAGNOSIS — Z992 Dependence on renal dialysis: Secondary | ICD-10-CM | POA: Diagnosis not present

## 2023-07-01 DIAGNOSIS — D509 Iron deficiency anemia, unspecified: Secondary | ICD-10-CM | POA: Diagnosis not present

## 2024-01-05 ENCOUNTER — Other Ambulatory Visit: Payer: Self-pay

## 2024-01-05 ENCOUNTER — Inpatient Hospital Stay (HOSPITAL_COMMUNITY)
Admission: EM | Admit: 2024-01-05 | Discharge: 2024-01-09 | DRG: 682 | Disposition: A | Discharge status: Home / Self Care | Attending: Internal Medicine | Admitting: Internal Medicine

## 2024-01-05 ENCOUNTER — Encounter (HOSPITAL_COMMUNITY): Payer: Self-pay

## 2024-01-05 ENCOUNTER — Emergency Department (HOSPITAL_COMMUNITY)

## 2024-01-05 DIAGNOSIS — Z7952 Long term (current) use of systemic steroids: Secondary | ICD-10-CM

## 2024-01-05 DIAGNOSIS — N186 End stage renal disease: Secondary | ICD-10-CM | POA: Diagnosis present

## 2024-01-05 DIAGNOSIS — Z992 Dependence on renal dialysis: Secondary | ICD-10-CM

## 2024-01-05 DIAGNOSIS — D631 Anemia in chronic kidney disease: Secondary | ICD-10-CM | POA: Diagnosis present

## 2024-01-05 DIAGNOSIS — T8612 Kidney transplant failure: Secondary | ICD-10-CM | POA: Diagnosis present

## 2024-01-05 DIAGNOSIS — Z940 Kidney transplant status: Secondary | ICD-10-CM

## 2024-01-05 DIAGNOSIS — Y838 Other surgical procedures as the cause of abnormal reaction of the patient, or of later complication, without mention of misadventure at the time of the procedure: Secondary | ICD-10-CM | POA: Diagnosis present

## 2024-01-05 DIAGNOSIS — Z5941 Food insecurity: Secondary | ICD-10-CM

## 2024-01-05 DIAGNOSIS — Z7982 Long term (current) use of aspirin: Secondary | ICD-10-CM

## 2024-01-05 DIAGNOSIS — J810 Acute pulmonary edema: Principal | ICD-10-CM

## 2024-01-05 DIAGNOSIS — I160 Hypertensive urgency: Secondary | ICD-10-CM | POA: Diagnosis present

## 2024-01-05 DIAGNOSIS — Z79899 Other long term (current) drug therapy: Secondary | ICD-10-CM

## 2024-01-05 DIAGNOSIS — Z888 Allergy status to other drugs, medicaments and biological substances status: Secondary | ICD-10-CM

## 2024-01-05 DIAGNOSIS — E8770 Fluid overload, unspecified: Secondary | ICD-10-CM | POA: Diagnosis present

## 2024-01-05 DIAGNOSIS — J811 Chronic pulmonary edema: Secondary | ICD-10-CM | POA: Diagnosis present

## 2024-01-05 DIAGNOSIS — I1311 Hypertensive heart and chronic kidney disease without heart failure, with stage 5 chronic kidney disease, or end stage renal disease: Principal | ICD-10-CM | POA: Diagnosis present

## 2024-01-05 DIAGNOSIS — Z1152 Encounter for screening for COVID-19: Secondary | ICD-10-CM

## 2024-01-05 DIAGNOSIS — R7989 Other specified abnormal findings of blood chemistry: Secondary | ICD-10-CM | POA: Diagnosis present

## 2024-01-05 LAB — CBC WITH DIFFERENTIAL/PLATELET
Abs Immature Granulocytes: 0.01 K/uL (ref 0.00–0.07)
Basophils Absolute: 0 K/uL (ref 0.0–0.1)
Basophils Relative: 1 %
Eosinophils Absolute: 0.1 K/uL (ref 0.0–0.5)
Eosinophils Relative: 2 %
HCT: 33 % — ABNORMAL LOW (ref 39.0–52.0)
Hemoglobin: 10.9 g/dL — ABNORMAL LOW (ref 13.0–17.0)
Immature Granulocytes: 0 %
Lymphocytes Relative: 7 %
Lymphs Abs: 0.4 K/uL — ABNORMAL LOW (ref 0.7–4.0)
MCH: 34.8 pg — ABNORMAL HIGH (ref 26.0–34.0)
MCHC: 33 g/dL (ref 30.0–36.0)
MCV: 105.4 fL — ABNORMAL HIGH (ref 80.0–100.0)
Monocytes Absolute: 0.3 K/uL (ref 0.1–1.0)
Monocytes Relative: 6 %
Neutro Abs: 4.7 K/uL (ref 1.7–7.7)
Neutrophils Relative %: 84 %
Platelets: 213 K/uL (ref 150–400)
RBC: 3.13 MIL/uL — ABNORMAL LOW (ref 4.22–5.81)
RDW: 16.7 % — ABNORMAL HIGH (ref 11.5–15.5)
WBC: 5.5 K/uL (ref 4.0–10.5)
nRBC: 0 % (ref 0.0–0.2)

## 2024-01-05 LAB — RESP PANEL BY RT-PCR (RSV, FLU A&B, COVID)  RVPGX2
Influenza A by PCR: NEGATIVE
Influenza B by PCR: NEGATIVE
Resp Syncytial Virus by PCR: NEGATIVE
SARS Coronavirus 2 by RT PCR: NEGATIVE

## 2024-01-05 LAB — COMPREHENSIVE METABOLIC PANEL WITH GFR
ALT: 13 U/L (ref 0–44)
AST: 20 U/L (ref 15–41)
Albumin: 3.5 g/dL (ref 3.5–5.0)
Alkaline Phosphatase: 132 U/L — ABNORMAL HIGH (ref 38–126)
Anion gap: 16 — ABNORMAL HIGH (ref 5–15)
BUN: 68 mg/dL — ABNORMAL HIGH (ref 8–23)
CO2: 24 mmol/L (ref 22–32)
Calcium: 8.8 mg/dL — ABNORMAL LOW (ref 8.9–10.3)
Chloride: 104 mmol/L (ref 98–111)
Creatinine, Ser: 13.85 mg/dL — ABNORMAL HIGH (ref 0.61–1.24)
GFR, Estimated: 4 mL/min — ABNORMAL LOW (ref >60)
Glucose, Bld: 90 mg/dL (ref 70–99)
Potassium: 4.1 mmol/L (ref 3.5–5.1)
Sodium: 144 mmol/L (ref 135–145)
Total Bilirubin: 0.9 mg/dL (ref 0.0–1.2)
Total Protein: 6.7 g/dL (ref 6.5–8.1)

## 2024-01-05 LAB — TROPONIN I (HIGH SENSITIVITY): Troponin I (High Sensitivity): 61 ng/L — ABNORMAL HIGH (ref <18)

## 2024-01-05 MED ORDER — HYDRALAZINE HCL 25 MG PO TABS
25.0000 mg | ORAL_TABLET | Freq: Once | ORAL | Status: AC
  Administered 2024-01-05: 25 mg via ORAL
  Filled 2024-01-05: qty 1

## 2024-01-05 NOTE — ED Triage Notes (Signed)
 Arrives [facility] from home with concern of viral symptoms, suspected fluid overload, and general weakness.   Says he had a temp of ~100 at home prior to paramedic arrival. Generalized body aches and sbp >200.   Paramedics report kidney transplant and back on dialysis M, W, F. Sbp enroute was 240.

## 2024-01-05 NOTE — ED Provider Notes (Incomplete)
 [facility]-EMERGENCY DEPT [facility] Emergency Department Provider Note MRN:  [id number]  Arrival date & time: 01/06/24     Chief Complaint   Weakness   History of Present Illness   Adam Dillon is a 63 y.o. year-old male with a history of hypertension, kidney transplant presenting to the ED with chief complaint of weakness.  Patient has been feeling weak and generally unwell for the past 3 to 4 days.  Feels like he has the flu.  Having some shortness of breath, denies chest pain, denies abdominal pain, no nausea vomiting or diarrhea.  Explains that he has a history of kidney transplant back in 2017.  That kidney has since failed a few years ago and he has been on dialysis for the past 2 years.  Last dialysis was Friday morning.  Review of Systems  A thorough review of systems was obtained and all systems are negative except as noted in the HPI and PMH.   Patient's Health History    Past Medical History:  Diagnosis Date   Cancer [facility])    Hypertension    Renal disorder     Past Surgical History:  Procedure Laterality Date   TRANSPLANTATION RENAL      History reviewed. No pertinent family history.  Social History   Socioeconomic History   Marital status: Single    Spouse name: Not on file   Number of children: Not on file   Years of education: Not on file   Highest education level: Not on file  Occupational History   Not on file  Tobacco Use   Smoking status: Never   Smokeless tobacco: Never  Substance and Sexual Activity   Alcohol use: No   Drug use: Yes    Types: Marijuana   Sexual activity: Not on file  Other Topics Concern   Not on file  Social History Narrative   Not on file   Social Drivers of Health   Financial Resource Strain: Low Risk  (05/01/2022)   Received from [facility]   Overall Financial Resource Strain (CARDIA)    Difficulty of Paying Living Expenses: Not hard at all  Food Insecurity: Medium Risk (10/12/2023)   Received from [facility]    Hunger Vital Sign    Within the past 12 months, you worried that your food would run out before you got money to buy more: Sometimes true    Within the past 12 months, the food you bought just didn't last and you didn't have money to get more. : Sometimes true  Transportation Needs: No Transportation Needs (10/12/2023)   Received from [employer]    In the past 12 months, has lack of reliable transportation kept you from medical appointments, meetings, work or from getting things needed for daily living? : No  Physical Activity: Not on file  Stress: No Stress Concern Present (04/03/2022)   Received from [facility] - Occupational Stress Questionnaire    Feeling of Stress : Only a little  Social Connections: Not on file  Intimate Partner Violence: Unknown (05/30/2022)   Received from [facility] visits prior to 06/02/2022.   Safety    How often does anyone, including family and friends, physically hurt you?: Pt declined to answer    How often does anyone, including family and friends, insult or talk down to you?: Pt declined to answer    How often does anyone, including family and friends,  threaten you with harm?: Pt declined to answer    How often does anyone, including family and friends, scream or curse at you?: Pt declined to answer     Physical Exam   Vitals:   01/06/24 0130 01/06/24 0145  BP: (!) 195/91 (!) 193/91  Pulse: 73 85  Resp: 13 20  Temp:    SpO2: 93% 97%    CONSTITUTIONAL: Well-appearing, NAD NEURO/PSYCH:  Alert and oriented x 3, no focal deficits EYES:  eyes equal and reactive ENT/NECK:  no LAD, no JVD CARDIO: Regular rate, well-perfused, normal S1 and S2 PULM:  CTAB no wheezing or rhonchi GI/GU:  non-distended, non-tender MSK/SPINE:  No gross deformities, no edema SKIN:  no rash, atraumatic   *Additional and/or pertinent findings included in MDM below  Diagnostic and  Interventional Summary    EKG Interpretation Date/Time:  Sunday January 05 2024 23:28:01 EDT Ventricular Rate:  80 PR Interval:  193 QRS Duration:  112 QT Interval:  466 QTC Calculation: 538 R Axis:   88  Text Interpretation: Sinus rhythm Left atrial enlargement LVH with secondary repolarization abnormality Prolonged QT interval Confirmed by Theadore Sharper [phone]) on 01/06/2024 12:13:06 AM       Labs Reviewed  CBC WITH DIFFERENTIAL/PLATELET - Abnormal; Notable for the following components:      Result Value   RBC 3.13 (*)    Hemoglobin 10.9 (*)    HCT 33.0 (*)    MCV 105.4 (*)    MCH 34.8 (*)    RDW 16.7 (*)    Lymphs Abs 0.4 (*)    All other components within normal limits  COMPREHENSIVE METABOLIC PANEL WITH GFR - Abnormal; Notable for the following components:   BUN 68 (*)    Creatinine, Ser 13.85 (*)    Calcium 8.8 (*)    Alkaline Phosphatase 132 (*)    GFR, Estimated 4 (*)    Anion gap 16 (*)    All other components within normal limits  BRAIN NATRIURETIC PEPTIDE - Abnormal; Notable for the following components:   B Natriuretic Peptide >4,500.0 (*)    All other components within normal limits  TROPONIN I (HIGH SENSITIVITY) - Abnormal; Notable for the following components:   Troponin I (High Sensitivity) 61 (*)    All other components within normal limits  TROPONIN I (HIGH SENSITIVITY) - Abnormal; Notable for the following components:   Troponin I (High Sensitivity) 65 (*)    All other components within normal limits  RESP PANEL BY RT-PCR (RSV, FLU A&B, COVID)  RVPGX2  HEPATITIS B SURFACE ANTIBODY, QUANTITATIVE  HEPATITIS B SURFACE ANTIGEN    DG Chest Port 1 View  Final Result      Medications  Chlorhexidine Gluconate Cloth 2 % PADS 6 each (has no administration in time range)  hydrALAZINE (APRESOLINE) tablet 25 mg (25 mg Oral Given 01/05/24 2357)  labetalol  (NORMODYNE ) injection 10 mg (10 mg Intravenous Given 01/06/24 0220)     Procedures  /  Critical  Care .Critical Care  Performed by: Theadore Sharper HERO, MD Authorized by: Theadore Sharper HERO, MD   Critical care provider statement:    Critical care time (minutes):  35   Critical care was necessary to treat or prevent imminent or life-threatening deterioration of the following conditions: Hypertensive urgency.   Critical care was time spent personally by me on the following activities:  Development of treatment plan with patient or surrogate, discussions with consultants, evaluation of patient's response to treatment, examination of patient, ordering  and review of laboratory studies, ordering and review of radiographic studies, ordering and performing treatments and interventions, pulse oximetry, re-evaluation of patient's condition and review of old charts   ED Course and Medical Decision Making  Initial Impression and Ddx Differential diagnosis includes electrolyte disturbance, fluid overloaded state, pneumonia, flu or viral illness.  Hypertensive urgency also considered given patient's blood pressures.  Past medical/surgical history that increases complexity of ED encounter: ESRD  Interpretation of Diagnostics I personally reviewed the Laboratory Testing and my interpretation is as follows: Elevated BUN/creatinine anticipated, elevated troponin, slightly rising on repeat.    Patient Reassessment and Ultimate Disposition/Management     Continued high blood pressures, malaise, Dr. Vassie of nephrology agrees to set up dialysis for the morning.  Will request hospitalist admission.  2:20 AM update: Case discussed with Dr. Charlton of hospital service, after considering recommendation from nephrology, plan is for hospitalist team to consult and manage blood pressure while patient awaits for dialysis, plan is for patient to still receive dialysis and return to the emergency department for reassessment and possible discharge thereafter.  Patient management required discussion with the following  services or consulting groups:  Hospitalist Service and Nephrology  Complexity of Problems Addressed Acute illness or injury that poses threat of life of bodily function  Additional Data Reviewed and Analyzed Further history obtained from: Prior labs/imaging results  Additional Factors Impacting ED Encounter Risk Consideration of hospitalization  Ozell HERO. Theadore, MD [facility] Emergency Medicine [facility] [email or web address]  Final Clinical Impressions(s) / ED Diagnoses     ICD-10-CM   1. Acute pulmonary edema (HCC)  J81.0     2. Hypervolemia, unspecified hypervolemia type  E87.70     3. Hypertensive urgency  I16.0     4. Elevated troponin  R79.89       ED Discharge Orders     None        Discharge Instructions Discussed with and Provided to Patient:   Discharge Instructions   None      Theadore Ozell HERO, MD 01/06/24 0202    Theadore Ozell HERO, MD 01/06/24 [phone]

## 2024-01-06 ENCOUNTER — Encounter (HOSPITAL_COMMUNITY): Payer: Self-pay | Admitting: Family Medicine

## 2024-01-06 DIAGNOSIS — Z940 Kidney transplant status: Secondary | ICD-10-CM | POA: Diagnosis not present

## 2024-01-06 DIAGNOSIS — R7989 Other specified abnormal findings of blood chemistry: Secondary | ICD-10-CM | POA: Diagnosis not present

## 2024-01-06 DIAGNOSIS — I160 Hypertensive urgency: Secondary | ICD-10-CM

## 2024-01-06 DIAGNOSIS — E8770 Fluid overload, unspecified: Secondary | ICD-10-CM | POA: Diagnosis present

## 2024-01-06 DIAGNOSIS — N186 End stage renal disease: Secondary | ICD-10-CM | POA: Diagnosis not present

## 2024-01-06 DIAGNOSIS — Z992 Dependence on renal dialysis: Secondary | ICD-10-CM

## 2024-01-06 DIAGNOSIS — E8779 Other fluid overload: Secondary | ICD-10-CM

## 2024-01-06 LAB — RENAL FUNCTION PANEL
Albumin: 3.4 g/dL — ABNORMAL LOW (ref 3.5–5.0)
Anion gap: 20 — ABNORMAL HIGH (ref 5–15)
BUN: 73 mg/dL — ABNORMAL HIGH (ref 8–23)
CO2: 22 mmol/L (ref 22–32)
Calcium: 8.6 mg/dL — ABNORMAL LOW (ref 8.9–10.3)
Chloride: 99 mmol/L (ref 98–111)
Creatinine, Ser: 14.77 mg/dL — ABNORMAL HIGH (ref 0.61–1.24)
GFR, Estimated: 3 mL/min — ABNORMAL LOW (ref >60)
Glucose, Bld: 85 mg/dL (ref 70–99)
Phosphorus: 4.8 mg/dL — ABNORMAL HIGH (ref 2.5–4.6)
Potassium: 4.1 mmol/L (ref 3.5–5.1)
Sodium: 141 mmol/L (ref 135–145)

## 2024-01-06 LAB — CBC
HCT: 32.9 % — ABNORMAL LOW (ref 39.0–52.0)
Hemoglobin: 11.1 g/dL — ABNORMAL LOW (ref 13.0–17.0)
MCH: 34.8 pg — ABNORMAL HIGH (ref 26.0–34.0)
MCHC: 33.7 g/dL (ref 30.0–36.0)
MCV: 103.1 fL — ABNORMAL HIGH (ref 80.0–100.0)
Platelets: 215 K/uL (ref 150–400)
RBC: 3.19 MIL/uL — ABNORMAL LOW (ref 4.22–5.81)
RDW: 16.7 % — ABNORMAL HIGH (ref 11.5–15.5)
WBC: 5.4 K/uL (ref 4.0–10.5)
nRBC: 0 % (ref 0.0–0.2)

## 2024-01-06 LAB — TROPONIN I (HIGH SENSITIVITY)
Troponin I (High Sensitivity): 65 ng/L — ABNORMAL HIGH (ref <18)
Troponin I (High Sensitivity): 71 ng/L — ABNORMAL HIGH (ref <18)

## 2024-01-06 LAB — HEPATITIS B SURFACE ANTIGEN: Hepatitis B Surface Ag: NONREACTIVE

## 2024-01-06 LAB — D-DIMER, QUANTITATIVE: D-Dimer, Quant: 1.14 ug{FEU}/mL — ABNORMAL HIGH (ref 0.00–0.50)

## 2024-01-06 LAB — BRAIN NATRIURETIC PEPTIDE: B Natriuretic Peptide: >4500 pg/mL — ABNORMAL HIGH (ref 0.0–100.0)

## 2024-01-06 MED ORDER — ACETAMINOPHEN 325 MG PO TABS
650.0000 mg | ORAL_TABLET | Freq: Four times a day (QID) | ORAL | Status: DC | PRN
  Administered 2024-01-06 – 2024-01-08 (×3): 650 mg via ORAL
  Filled 2024-01-06 (×4): qty 2

## 2024-01-06 MED ORDER — CLONIDINE HCL 0.1 MG PO TABS
0.1000 mg | ORAL_TABLET | Freq: Once | ORAL | Status: AC
  Administered 2024-01-06: 0.1 mg via ORAL
  Filled 2024-01-06: qty 1

## 2024-01-06 MED ORDER — POLYETHYLENE GLYCOL 3350 17 G PO PACK: 17.0000 g | PACK | Freq: Every day | ORAL | Status: DC | PRN

## 2024-01-06 MED ORDER — TRIMETHOBENZAMIDE HCL 100 MG/ML IM SOLN: 200.0000 mg | Freq: Four times a day (QID) | INTRAMUSCULAR | Status: DC | PRN

## 2024-01-06 MED ORDER — LOSARTAN POTASSIUM 50 MG PO TABS
100.0000 mg | ORAL_TABLET | Freq: Every morning | ORAL | Status: DC
Start: 2024-01-06 — End: 2024-01-08
  Administered 2024-01-06 – 2024-01-07 (×2): 100 mg via ORAL
  Filled 2024-01-06 (×2): qty 2

## 2024-01-06 MED ORDER — ASPIRIN 81 MG PO TBEC
81.0000 mg | DELAYED_RELEASE_TABLET | Freq: Every day | ORAL | Status: DC
  Administered 2024-01-06 – 2024-01-09 (×4): 81 mg via ORAL
  Filled 2024-01-06 (×5): qty 1

## 2024-01-06 MED ORDER — HYDRALAZINE HCL 50 MG PO TABS
100.0000 mg | ORAL_TABLET | Freq: Three times a day (TID) | ORAL | Status: DC
  Administered 2024-01-06 – 2024-01-09 (×10): 100 mg via ORAL
  Filled 2024-01-06 (×10): qty 2

## 2024-01-06 MED ORDER — DOXAZOSIN MESYLATE 8 MG PO TABS
8.0000 mg | ORAL_TABLET | Freq: Every day | ORAL | Status: DC
  Administered 2024-01-06 – 2024-01-08 (×3): 8 mg via ORAL
  Filled 2024-01-06 (×3): qty 1

## 2024-01-06 MED ORDER — FENTANYL CITRATE PF 50 MCG/ML IJ SOSY
12.5000 ug | PREFILLED_SYRINGE | Freq: Once | INTRAMUSCULAR | Status: AC
  Administered 2024-01-06: 12.5 ug via INTRAVENOUS
  Filled 2024-01-06: qty 1

## 2024-01-06 MED ORDER — HYDRALAZINE HCL 20 MG/ML IJ SOLN
10.0000 mg | Freq: Once | INTRAMUSCULAR | Status: AC
  Administered 2024-01-06: 10 mg via INTRAVENOUS
  Filled 2024-01-06: qty 1

## 2024-01-06 MED ORDER — CHLORHEXIDINE GLUCONATE CLOTH 2 % EX PADS
6.0000 | MEDICATED_PAD | Freq: Every day | CUTANEOUS | Status: DC
  Administered 2024-01-07 – 2024-01-08 (×2): 6 via TOPICAL

## 2024-01-06 MED ORDER — LABETALOL HCL 200 MG PO TABS
200.0000 mg | ORAL_TABLET | Freq: Two times a day (BID) | ORAL | Status: DC
Start: 2024-01-06 — End: 2024-01-07
  Administered 2024-01-06 – 2024-01-07 (×3): 200 mg via ORAL
  Filled 2024-01-06 (×2): qty 1

## 2024-01-06 MED ORDER — PREDNISONE 5 MG PO TABS
5.0000 mg | ORAL_TABLET | Freq: Every day | ORAL | Status: DC
  Filled 2024-01-06 (×3): qty 1

## 2024-01-06 MED ORDER — LABETALOL HCL 5 MG/ML IV SOLN
10.0000 mg | Freq: Once | INTRAVENOUS | Status: AC
  Administered 2024-01-06: 10 mg via INTRAVENOUS
  Filled 2024-01-06: qty 4

## 2024-01-06 MED ORDER — LABETALOL HCL 5 MG/ML IV SOLN: 10.0000 mg | INTRAVENOUS | Status: DC | PRN

## 2024-01-06 MED ORDER — HEPARIN SODIUM (PORCINE) 5000 UNIT/ML IJ SOLN
5000.0000 [IU] | Freq: Three times a day (TID) | INTRAMUSCULAR | Status: DC
  Administered 2024-01-06 – 2024-01-08 (×7): 5000 [IU] via SUBCUTANEOUS
  Filled 2024-01-06 (×7): qty 1

## 2024-01-06 MED ORDER — LABETALOL HCL 200 MG PO TABS: 200.0000 mg | ORAL_TABLET | Freq: Two times a day (BID) | ORAL | Status: DC

## 2024-01-06 MED ORDER — AMLODIPINE BESYLATE 10 MG PO TABS
10.0000 mg | ORAL_TABLET | Freq: Every day | ORAL | Status: DC
Start: 2024-01-06 — End: 2024-01-08
  Administered 2024-01-06 – 2024-01-08 (×3): 10 mg via ORAL
  Filled 2024-01-06: qty 1
  Filled 2024-01-06: qty 2
  Filled 2024-01-06: qty 1

## 2024-01-06 MED ORDER — ONDANSETRON HCL 4 MG/2ML IJ SOLN
4.0000 mg | Freq: Four times a day (QID) | INTRAMUSCULAR | Status: DC | PRN
  Administered 2024-01-06: 4 mg via INTRAVENOUS
  Filled 2024-01-06: qty 2

## 2024-01-06 MED ORDER — TACROLIMUS ER 4 MG PO TB24
8.0000 mg | ORAL_TABLET | Freq: Every day | ORAL | Status: DC
  Administered 2024-01-06 – 2024-01-08 (×3): 8 mg via ORAL
  Filled 2024-01-06 (×5): qty 2

## 2024-01-06 MED ORDER — SEVELAMER CARBONATE 800 MG PO TABS
800.0000 mg | ORAL_TABLET | Freq: Three times a day (TID) | ORAL | Status: DC
  Administered 2024-01-06 – 2024-01-09 (×6): 800 mg via ORAL
  Filled 2024-01-06 (×7): qty 1

## 2024-01-06 MED ORDER — AMLODIPINE BESYLATE 5 MG PO TABS: 10.0000 mg | ORAL_TABLET | Freq: Every day | ORAL | Status: DC

## 2024-01-06 MED ORDER — MAGNESIUM OXIDE -MG SUPPLEMENT 400 (240 MG) MG PO TABS
400.0000 mg | ORAL_TABLET | Freq: Two times a day (BID) | ORAL | Status: DC
  Administered 2024-01-06 – 2024-01-09 (×6): 400 mg via ORAL
  Filled 2024-01-06 (×6): qty 1

## 2024-01-06 MED ORDER — METHOCARBAMOL 1000 MG/10ML IJ SOLN
500.0000 mg | Freq: Four times a day (QID) | INTRAMUSCULAR | Status: DC | PRN
  Administered 2024-01-06 – 2024-01-08 (×2): 500 mg via INTRAVENOUS
  Filled 2024-01-06 (×4): qty 10

## 2024-01-06 MED ORDER — LABETALOL HCL 5 MG/ML IV SOLN
20.0000 mg | INTRAVENOUS | Status: DC | PRN
  Administered 2024-01-06 – 2024-01-08 (×5): 20 mg via INTRAVENOUS
  Filled 2024-01-06 (×5): qty 4

## 2024-01-06 NOTE — Procedures (Addendum)
 Patient seen and examined on Hemodialysis. The procedure was supervised and I have made appropriate changes. BP (!) 188/87   Pulse 73   Temp 97.6 F (36.4 C)   Resp 15   Ht 6' 1 (1.854 m)   Wt 83.9 kg   SpO2 94%   BMI 24.41 kg/m   QB 400 mL/ min, UF goal 3L   Cramping, thinks that he may have gained weight.    UF turned off, BFR down to 300 mL/ min.  BP is still elevated, 208/98.  Scheduled to receive labetalol  200 mg PO and hydralazine 100 mg PO ( I just added this). Received amlodipine  at 0322 this AM.   Give clonidine 0.1 mg now. Expect BP to improve with labetalol  and hydralazine administration.   Can add back his home losartan if needed as well. Anticipate admission.    Almarie Bonine MD [facility] [phone] 11:20 AM

## 2024-01-06 NOTE — ED Notes (Signed)
 Pt taken up to rm on monitor

## 2024-01-06 NOTE — H&P (Signed)
 History and Physical    Adam Dillon FMW:[id number] DOB: May 31, 1960 DOA: 01/05/2024  PCP: [facility] [address]   Patient coming from: Home    Chief Complaint: Severe hypertension  HPI: Adam Dillon is a 63 y.o. 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  Symptoms progressively worsen over the last 3 days so he presented to the Emergency Department.  Report of consuming more salt than usual recently.   His  last dialysis was on 01/03/2024 .On presentation, he was severely hypertensive with systolic blood pressure more than 200.  Lab work showed creatinine of 1.8, anion gap of 16, BNP of more than 4500, hemoglobin of 10.9.  Initial troponin was elevated at 61. Nephrology was consulted by ED physician and the plan was for discharge after dialysis.  We are consulted for management of severe hypertension. Patient seen and examined at the bedside at dialysis suite.  He continued to remain hypertensive with systolic blood pressure in the range of 190 mmHg.  Being given IV labetalol .  He denies any chest pain, shortness of breath, cough, abdominal pain, nausea, vomiting, headache, dizziness.  He was having some lower extremity cramps while at dialysis. Since patient's blood pressure continued to remain high, decision made to admit the patient.   ED Course: Remained hypertensive.  Alert and oriented.  Review of Systems: As per HPI otherwise 10 point review of systems negative.    Past Medical History:  Diagnosis Date   Cancer (HCC)    Hypertension    Renal disorder     Past Surgical History:  Procedure Laterality Date   TRANSPLANTATION RENAL       reports that he has never smoked. He has never used smokeless tobacco. He reports current drug use. Drug: Marijuana. He reports that he does not drink alcohol.  Allergies   Allergen Reactions   Baclofen Other (See Comments)    Other Reaction(s): Confusion, Confusion, History Unknown  Patient unable to tolerate baclofen d/t ESRD status. Patient admitted to hospital d/t AMS with combative behavior requiring emergent HD.   Hydralazine     Orthostatic hypotension    History reviewed. No pertinent family history.   Prior to Admission medications   Medication Sig Start Date End Date Taking? Authorizing Provider  acetaminophen  (TYLENOL ) 500 MG tablet Take 1,000 mg by mouth every 6 (six) hours as needed for mild pain (pain score 1-3) or headache.   Yes [provider]  amLODipine  (NORVASC ) 5 MG tablet Take 5 mg by mouth daily. 07/12/14  Yes [provider]  aspirin  EC 81 MG tablet Take 81 mg by mouth daily. 06/06/15  Yes [provider]  COMBIGAN 0.2-0.5 % ophthalmic solution Apply 1 drop to eye 2 (two) times daily. 11/21/23  Yes [provider]  doxazosin (CARDURA) 8 MG tablet Take 8 mg by mouth daily. 12/07/23  Yes [provider]  hydrALAZINE (APRESOLINE) 100 MG tablet Take 100 mg by mouth 3 (three) times daily. 12/12/23  Yes [provider]  labetalol  (NORMODYNE ) 200 MG tablet Take 200 mg by mouth 2 (two) times daily.  06/16/14  Yes [provider]  losartan (COZAAR) 100 MG tablet Take 100 mg by mouth every morning. 12/26/23  Yes [provider]  LUMIGAN 0.01 % SOLN Place 1 drop into both eyes at bedtime. 10/10/23  Yes [provider]  magnesium   oxide (MAG-OX) 400 MG tablet Take 400 mg by mouth 2 (two) times daily. 07/30/16  Yes [provider]  predniSONE  (DELTASONE ) 5 MG tablet Take 5 mg by mouth daily. 07/07/19  Yes [provider]  Tacrolimus  ER (ENVARSUS  XR) 4 MG TB24 Take 8 mg by mouth daily.   Yes [provider]    Physical Exam: Vitals:   01/06/24 1100 01/06/24 1110 01/06/24 1115 01/06/24 1241  BP: (!) 201/95 (!) 210/99 (!) 206/98 (!) 210/93  Pulse: 79  81 81 79  Resp: (!) 23 20 11 18   Temp:   97.7 F (36.5 C) 98.1 F (36.7 C)  TempSrc:    Temporal  SpO2: 98% 99% 100% 98%  Weight:      Height:        Constitutional: NAD, calm, comfortable Vitals:   01/06/24 1100 01/06/24 1110 01/06/24 1115 01/06/24 1241  BP: (!) 201/95 (!) 210/99 (!) 206/98 (!) 210/93  Pulse: 79 81 81 79  Resp: (!) 23 20 11 18   Temp:   97.7 F (36.5 C) 98.1 F (36.7 C)  TempSrc:    Temporal  SpO2: 98% 99% 100% 98%  Weight:      Height:       Eyes: PERRL, lids and conjunctivae normal ENMT: Mucous membranes are moist.  Neck: normal, supple, no masses, no thyromegaly Respiratory: clear to auscultation bilaterally, no wheezing, no crackles. Normal respiratory effort. No accessory muscle use.  Cardiovascular: Regular rate and rhythm, no murmurs / rubs / gallops. No extremity edema.  Abdomen: no tenderness, no masses palpated. No hepatosplenomegaly. Bowel sounds positive.  Musculoskeletal: no clubbing / cyanosis. No joint deformity upper and lower extremities.  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 No induration Neurologic: CN 2-12 grossly intact.  Strength 5/5 in all 4.  Psychiatric: Normal judgment and insight. Alert and oriented x 3. Normal mood.   Foley Catheter:None  Labs on Admission: I have personally reviewed following labs and imaging studies  CBC: Recent Labs  Lab 01/05/24 2250 01/06/24 0756  WBC 5.5 5.4  NEUTROABS 4.7  --   HGB 10.9* 11.1*  HCT 33.0* 32.9*  MCV 105.4* 103.1*  PLT 213 215   Basic Metabolic Panel: Recent Labs  Lab 01/05/24 2250 01/06/24 0757  NA 144 141  K 4.1 4.1  CL 104 99  CO2 24 22  GLUCOSE 90 85  BUN 68* 73*  CREATININE 13.85* 14.77*  CALCIUM 8.8* 8.6*  PHOS  --  4.8*   GFR: Estimated Creatinine Clearance: 5.7 mL/min (A) (by C-G formula based on SCr of 14.77 mg/dL (H)). Liver Function Tests: Recent Labs  Lab 01/05/24 2250 01/06/24 0757  AST 20  --   ALT 13  --    ALKPHOS 132*  --   BILITOT 0.9  --   PROT 6.7  --   ALBUMIN 3.5 3.4*   No results for input(s): LIPASE, AMYLASE in the last 168 hours. No results for input(s): AMMONIA in the last 168 hours. Coagulation Profile: No results for input(s): INR, PROTIME in the last 168 hours. Cardiac Enzymes: No results for input(s): CKTOTAL, CKMB, CKMBINDEX, TROPONINI in the last 168 hours. BNP (last 3 results) No results for input(s): PROBNP in the last 8760 hours. HbA1C: No results for input(s): HGBA1C in the last 72 hours. CBG: No results for input(s): GLUCAP in the last 168 hours. Lipid Profile: No results for input(s): CHOL, HDL, LDLCALC, TRIG, CHOLHDL, LDLDIRECT in the last 72 hours.  Thyroid Function Tests: No results for input(s): TSH, T4TOTAL, FREET4, T3FREE, THYROIDAB in the last 72 hours. Anemia Panel: No results for input(s): VITAMINB12, FOLATE, FERRITIN, TIBC, IRON, RETICCTPCT in the last 72 hours. Urine analysis:    Component Value Date/Time   COLORURINE YELLOW 09/15/2019 1755   APPEARANCEUR CLEAR 09/15/2019 1755   LABSPEC 1.009 09/15/2019 1755   PHURINE 5.0 09/15/2019 1755   GLUCOSEU NEGATIVE 09/15/2019 1755   HGBUR MODERATE (A) 09/15/2019 1755   BILIRUBINUR NEGATIVE 09/15/2019 1755   KETONESUR NEGATIVE 09/15/2019 1755   PROTEINUR >=300 (A) 09/15/2019 1755   UROBILINOGEN 0.2 07/30/2014 1320   NITRITE NEGATIVE 09/15/2019 1755   LEUKOCYTESUR NEGATIVE 09/15/2019 1755    Radiological Exams on Admission: DG Chest Port 1 View Result Date: 01/05/2024 EXAM: 1 VIEW(S) XRAY OF THE CHEST 01/05/2024 11:26:51 PM COMPARISON: 10/09/2023 CLINICAL HISTORY: SOB. GC-EMS from home with concern of viral symptoms, suspected fluid overload, and general weakness. Fever. FINDINGS: LUNGS AND PLEURA: Diffuse mild interstitial pulmonary edema, likely cardiogenic in nature, new since prior examination. No pneumothorax. No pleural effusion. HEART AND  MEDIASTINUM: Cardiomegaly. Stable cardiomegaly. BONES AND SOFT TISSUES: No acute osseous abnormality. IMPRESSION: 1. Diffuse mild interstitial pulmonary edema, likely cardiogenic in nature, new since prior examination. 2. Stable cardiomegaly. Electronically signed by: Dorethia Molt MD 01/05/2024 11:55 PM EDT RP Workstation: [id number]     Assessment/Plan Principal Problem:   ESRD needing dialysis [facility]) Active Problems:   Renal transplant recipient   Volume overload   Hypertensive urgency   Elevated troponin  Hypertensive urgency: Takes multiple medications at home including Norvasc , labetalol , hydralazine, losartan, doxazosin.  These medications have been restarted.  Remains severely hypertensive even after dialysis.  Continue as needed labetalol . Monitor on telemetry.  Monitor blood pressure.  ESRD on dialysis/history of failed renal transplant: No report of missing dialysis sessions.  Reports adherence with fluid restrictions but may have consumed too much salt recently.  Chest x-Netzer with features of pulmonary edema.  Underwent dialysis today.  Nephrology following.  Also takes tacrolimus , prednisone  at home.  Elevated troponin: This is secondary to ESRD/severe hypertension.  No chest pain.  Troponins were elevated at flat trend.        Severity of Illness: The appropriate patient status for this patient is OBSERVATION.  DVT prophylaxis: Heparin  [place] Code Status: Full Family Communication:None at bedside  Consults called: Nephrology     Ivonne Mustache MD [facility]  01/06/2024, 12:48 PM

## 2024-01-06 NOTE — Progress Notes (Addendum)
 Patient arrived from [facility] to [phone]. Alert and oriented. Vitals obtained; telemetry placed. Complains of left sided rib cage pain. MD notified. New orders placed.

## 2024-01-06 NOTE — Consult Note (Signed)
 Initial Consultation Note   Patient: Adam Dillon:[id number] DOB: 1960/04/17 PCP: [facility] DOA: 01/05/2024 DOS: the patient was seen and examined on 01/06/2024 Primary service: Theadore Ozell HERO, MD  Referring physician: Dr. Theadore (Emergency Medicine)   Reason for consult: Severe hypertension, elevated troponin   Assessment/Plan:  1. Hypervolemia; ESRD; hx of renal transplant  - Denies missing any dialysis sessions and reports adherence with fluid-restrictions but may have had too much salt recently   - Pulmonary edema noted on CXR  - Appreciate nephrology assessment and plan; patient to be dialyzed and then reassessed in ED for possible d/c home - Restrict fluids, renally-dose medications, continue tacrolimus    2. Severe asymptomatic hypertension  - Anticipate improvement with dialysis  - Continue Norvasc  and labetalol , use additional labetalol  IV as-needed   3. Elevated troponin  - Troponin was mildly elevated and flat in ED without chest pain and not suggestive of ACS  - Continue BP-control, treatment of volume overload as above      [facility] will continue to follow the patient.  HPI: Adam Dillon is a 63 y.o. male with past medical history of hypertension and ESRD status post renal transplant in 2017 which has failed, now presenting with generalized weakness, malaise, and shortness of breath.  Patient reports that he completed dialysis on 01/03/2024, has not exceeded his fluid restriction, but may have consumed too much salt recently.  He has developed worsening shortness of breath, generalized weakness, and generalized aches and malaise over the past 3 days or so.  He denies chest pain, headache, abdominal pain, fever, or chills.   He presented to the ED where he is found to be severely hypertensive with normal potassium, normal bicarbonate, BUN 68, creatinine 13.85, normal WBC, hemoglobin 10.9, troponin 61, and BNP >4500.  Influenza, COVID, and RSV PCR were negative.  Chest  x-Flesch demonstrates stable cardiomegaly with diffuse interstitial edema.  Nephrology was consulted by the ED physician and plans to have the patient dialyzed later this morning and then reassessed in the ED for possible discharge home.  Patient was treated with oral hydralazine and intravenous labetalol  in the ED.    Review of Systems: As mentioned in the history of present illness. All other systems reviewed and are negative. Past Medical History:  Diagnosis Date   Cancer (HCC)    Hypertension    Renal disorder    Past Surgical History:  Procedure Laterality Date   TRANSPLANTATION RENAL     Social History:  reports that he has never smoked. He has never used smokeless tobacco. He reports current drug use. Drug: Marijuana. He reports that he does not drink alcohol.  No Known Allergies  History reviewed. No pertinent family history.  Prior to Admission medications   Medication Sig Start Date End Date Taking? Authorizing Provider  amLODipine  (NORVASC ) 10 MG tablet Take 10 mg by mouth daily. 07/12/14   [provider]  aspirin  EC 81 MG tablet Take 81 mg by mouth daily. 06/06/15   [provider]  HYDROcodone-acetaminophen  (NORCO/VICODIN) 5-325 MG tablet Take 1 tablet by mouth every 8 (eight) hours as needed for moderate pain.  10/11/16   [provider]  labetalol  (NORMODYNE ) 200 MG tablet Take 200 mg by mouth 2 (two) times daily.  06/16/14   [provider]  magnesium  oxide (MAG-OX) 400 MG tablet Take 400 mg by mouth 2 (two) times daily. 07/30/16   [provider]  mycophenolate  (MYFORTIC ) 180 MG EC tablet Take 720  mg by mouth 2 (two) times daily. 08/08/16   [provider]  predniSONE  (DELTASONE ) 5 MG tablet Take 5 mg by mouth daily. 07/07/19   [provider]  pregabalin (LYRICA) 75 MG capsule Take 75 mg by mouth 2 (two) times daily as needed (pain).    [provider]  sulfamethoxazole-trimethoprim (BACTRIM,SEPTRA) 400-80  MG tablet Take 1 tablet by mouth 3 (three) times a week. Mon / Wed / Fri 06/21/16   [provider]  Tacrolimus  ER (ENVARSUS  XR) 4 MG TB24 Take 8 mg by mouth daily.    [provider]    Physical Exam: Vitals:   01/06/24 0130 01/06/24 0145 01/06/24 0200 01/06/24 0215  BP: (!) 195/91 (!) 193/91 (!) 197/89 (!) 199/97  Pulse: 73 85 78 83  Resp: 13 20 11 17   Temp:      TempSrc:      SpO2: 93% 97% 96% 100%  Weight:      Height:         Constitutional: NAD, no pallor or diaphoresis   Eyes: PERRLA, lids and conjunctivae normal ENMT: Mucous membranes are moist. Posterior pharynx clear of any exudate or lesions.   Neck: supple, no masses  Respiratory: Dyspneic with speech. Fine rales bilaterally. No wheezing.  Cardiovascular: S1 & S2 heard, regular rate and rhythm. JVD.  Abdomen: No tenderness, soft. Bowel sounds active.  Musculoskeletal: no clubbing / cyanosis. No joint deformity upper and lower extremities.   Skin: no significant rashes, lesions, ulcers. Warm, dry, well-perfused. Neurologic: CN II-XII grossly intact. Moving all extremities. Alert and oriented to person, place, and situation.  Psychiatric: Calm. Cooperative.    Data Reviewed: Chart notes, vitals, labs, imaging.    Family Communication: None present   Primary team communication: Discussed case with Dr. Theadore.   Thank you very much for involving us  in the care of your patient.  Author: Evalene GORMAN Sprinkles, MD 01/06/2024 2:39 AM  For on call review [email or web address].

## 2024-01-06 NOTE — Progress Notes (Signed)
 Asked to see this patient for hospital dialysis. Pt presenting with gen weakness and SOB. CXR showed pulm edema. Pt is stable and not in distress. The plan will be for ED HD. Pt is not to be admitted at this time. Pt will go to the dialysis unit when they are ready for the patient (likely 1st shift Monday am). When dialysis is completed pt will be sent back to ED for reassessment.   OP HD: MWF [facility] From April 2025 --> 4h  B400   2K bath  AVF   Heparin  3500 bolus + 500 u/hr   Myer Fret MD  [facility] 01/06/2024, 12:37 AM

## 2024-01-06 NOTE — Progress Notes (Signed)
 Pt receives out-pt HD at [facility] on MWF 6:45 am arrival for 7:00 am chair time. Will assist as needed.   Randine Mungo Dialysis Navigator [phone]

## 2024-01-06 NOTE — Progress Notes (Signed)
   01/06/24 1115  Vitals  Temp 97.7 F (36.5 C)  Pulse Rate 81  Resp 11  BP (!) 206/98  SpO2 100 %  Post Treatment  Dialyzer Clearance Lightly streaked  Hemodialysis Intake (mL) 200 mL  Liters Processed 70.1  Fluid Removed (mL) 1500 mL  Tolerated HD Treatment No (Comment)  Post-Hemodialysis Comments TX TERMINATED 32 MINS EARLY DUE TO CRAMPING NOT RELIEVED BY INTERVENTION, MD AWARE  AVG/AVF Arterial Site Held (minutes) 10 minutes  AVG/AVF Venous Site Held (minutes) 10 minutes   Received patient in bed to unit.  Alert and oriented.  Informed consent signed and in chart.   TX duration:3HRS DUE TO CRAMPING BP REMAINS ELEVATED MD AWARE MAKING CHANGES TO MEDICATIONS  Patient tolerated well.  Transported back to the room  Alert, without acute distress.  Hand-off given to patient's nurse.   Access used: RAVF Access issues: NONE  Total UF removed: 1.5L Medication(s) given: NONE   [facility]

## 2024-01-07 ENCOUNTER — Observation Stay (HOSPITAL_COMMUNITY)

## 2024-01-07 ENCOUNTER — Encounter (HOSPITAL_COMMUNITY): Payer: Self-pay | Admitting: Internal Medicine

## 2024-01-07 DIAGNOSIS — Z992 Dependence on renal dialysis: Secondary | ICD-10-CM | POA: Diagnosis not present

## 2024-01-07 DIAGNOSIS — N186 End stage renal disease: Secondary | ICD-10-CM | POA: Diagnosis not present

## 2024-01-07 LAB — BASIC METABOLIC PANEL WITH GFR
Anion gap: 11 (ref 5–15)
BUN: 48 mg/dL — ABNORMAL HIGH (ref 8–23)
CO2: 28 mmol/L (ref 22–32)
Calcium: 8.6 mg/dL — ABNORMAL LOW (ref 8.9–10.3)
Chloride: 98 mmol/L (ref 98–111)
Creatinine, Ser: 11.26 mg/dL — ABNORMAL HIGH (ref 0.61–1.24)
GFR, Estimated: 5 mL/min — ABNORMAL LOW (ref >60)
Glucose, Bld: 88 mg/dL (ref 70–99)
Potassium: 4.7 mmol/L (ref 3.5–5.1)
Sodium: 137 mmol/L (ref 135–145)

## 2024-01-07 LAB — MRSA NEXT GEN BY PCR, NASAL: MRSA by PCR Next Gen: NOT DETECTED

## 2024-01-07 LAB — HEPATITIS B SURFACE ANTIBODY, QUANTITATIVE: Hep B S AB Quant (Post): 11.4 m[IU]/mL

## 2024-01-07 LAB — TROPONIN I (HIGH SENSITIVITY): Troponin I (High Sensitivity): 69 ng/L — ABNORMAL HIGH (ref <18)

## 2024-01-07 MED ORDER — CLONIDINE HCL 0.1 MG PO TABS: 0.2000 mg | ORAL_TABLET | Freq: Three times a day (TID) | ORAL | Status: DC

## 2024-01-07 MED ORDER — SPIRONOLACTONE 25 MG PO TABS
25.0000 mg | ORAL_TABLET | Freq: Every day | ORAL | Status: DC
Start: 2024-01-07 — End: 2024-01-09
  Administered 2024-01-07 – 2024-01-09 (×3): 25 mg via ORAL
  Filled 2024-01-07 (×3): qty 1

## 2024-01-07 MED ORDER — ORAL CARE MOUTH RINSE: 15.0000 mL | OROMUCOSAL | Status: DC | PRN

## 2024-01-07 MED ORDER — TIMOLOL MALEATE 0.5 % OP SOLN
1.0000 [drp] | Freq: Two times a day (BID) | OPHTHALMIC | Status: DC
  Administered 2024-01-07 – 2024-01-09 (×4): 1 [drp] via OPHTHALMIC
  Filled 2024-01-07: qty 5

## 2024-01-07 MED ORDER — PNEUMOCOCCAL 20-VAL CONJ VACC 0.5 ML IM SUSY
0.5000 mL | PREFILLED_SYRINGE | INTRAMUSCULAR | Status: DC
  Filled 2024-01-07: qty 0.5

## 2024-01-07 MED ORDER — CLONIDINE HCL 0.1 MG PO TABS
0.1000 mg | ORAL_TABLET | Freq: Three times a day (TID) | ORAL | Status: DC
Start: 2024-01-07 — End: 2024-01-07
  Administered 2024-01-07: 0.1 mg via ORAL
  Filled 2024-01-07: qty 1

## 2024-01-07 MED ORDER — LABETALOL HCL 200 MG PO TABS
200.0000 mg | ORAL_TABLET | Freq: Once | ORAL | Status: AC
  Administered 2024-01-07: 200 mg via ORAL
  Filled 2024-01-07: qty 1

## 2024-01-07 MED ORDER — BRIMONIDINE TARTRATE 0.2 % OP SOLN
1.0000 [drp] | Freq: Two times a day (BID) | OPHTHALMIC | Status: DC
  Administered 2024-01-07 – 2024-01-09 (×4): 1 [drp] via OPHTHALMIC
  Filled 2024-01-07: qty 5

## 2024-01-07 MED ORDER — INFLUENZA VIRUS VACC SPLIT PF (FLUZONE) 0.5 ML IM SUSY: 0.5000 mL | PREFILLED_SYRINGE | INTRAMUSCULAR | Status: DC

## 2024-01-07 MED ORDER — LABETALOL HCL 200 MG PO TABS
400.0000 mg | ORAL_TABLET | Freq: Two times a day (BID) | ORAL | Status: DC
  Administered 2024-01-07: 400 mg via ORAL
  Filled 2024-01-07: qty 2

## 2024-01-07 MED ORDER — TECHNETIUM TO 99M ALBUMIN AGGREGATED
4.4000 | Freq: Once | INTRAVENOUS | Status: AC
  Administered 2024-01-07: 4.4 via INTRAVENOUS

## 2024-01-07 MED ORDER — BIMATOPROST 0.01 % OP SOLN
1.0000 [drp] | Freq: Every day | OPHTHALMIC | Status: DC
  Administered 2024-01-07 – 2024-01-08 (×2): 1 [drp] via OPHTHALMIC
  Filled 2024-01-07: qty 2.5

## 2024-01-07 NOTE — Care Management Obs Status (Signed)
 MEDICARE OBSERVATION STATUS NOTIFICATION   Patient Details  Name: Adam Dillon MRN: [id number] Date of Birth: 1960/08/11   Medicare Observation Status Notification Given:  Yes    Vonzell Arrie Sharps 01/07/2024, 1:15 PM

## 2024-01-07 NOTE — Plan of Care (Signed)
  Problem: Education: Goal: Knowledge of General Education information will improve Description: Including pain rating scale, medication(s)/side effects and non-pharmacologic comfort measures Outcome: Progressing   Problem: Clinical Measurements: Goal: Ability to maintain clinical measurements within normal limits will improve. Blood pressures have greatly improved. Outcome: Progressing Goal: Will remain free from infection Outcome: Progressing Goal: Diagnostic test results will improve Outcome: Progressing

## 2024-01-07 NOTE — Progress Notes (Signed)
 [facility] Progress Note   Assessment/ Plan:   1. HTNsive urgency: multiple BP meds and per pt mult admissions.  Will do a workup for secondary HTN.  Will go ahead and to renal artery US  for both native and transplant kidneys to start, will also add spironolactone.  Will also do ARR and metanephrines 2. ESRD: MWF [facility]- HD on schedule tomorrow 10/8.  Wasn't able to get whole goal 10/6 d/t cramping, ? If he's hit his EDW 3. Anemia: Hgb 11.1 4. CKD-MBD: binders and vitamins 5. Nutrition:renal diet 6. Dispo: admitted  Subjective:    Seen in room. BP still pretty high.  Pt says he's been admitted for BP issues 4-5 times this year already.     Objective:   BP (!) 193/96 (BP Location: Left Arm)   Pulse 61   Temp 97.6 F (36.4 C) (Oral)   Resp 14   Ht 6' 1 (1.854 m)   Wt 74.2 kg   SpO2 92%   BMI 21.58 kg/m   Physical Exam: Gen:NAD CVS:RRR Resp: clear Abd: soft Ext: no LE edema  Labs: BMET Recent Labs  Lab 01/05/24 2250 01/06/24 0757 01/07/24 0355  NA 144 141 137  K 4.1 4.1 4.7  CL 104 99 98  CO2 24 22 28   GLUCOSE 90 85 88  BUN 68* 73* 48*  CREATININE 13.85* 14.77* 11.26*  CALCIUM 8.8* 8.6* 8.6*  PHOS  --  4.8*  --    CBC Recent Labs  Lab 01/05/24 2250 01/06/24 0756  WBC 5.5 5.4  NEUTROABS 4.7  --   HGB 10.9* 11.1*  HCT 33.0* 32.9*  MCV 105.4* 103.1*  PLT 213 215      Medications:     amLODipine   10 mg Oral Daily   aspirin  EC  81 mg Oral Daily   Chlorhexidine Gluconate Cloth  6 each Topical Q0600   cloNIDine  0.2 mg Oral TID   doxazosin  8 mg Oral Daily   heparin  injection (subcutaneous)  5,000 Units Subcutaneous Q8H   hydrALAZINE  100 mg Oral Q8H   [START ON 01/08/2024] influenza vac split trivalent PF  0.5 mL Intramuscular Tomorrow-1000   labetalol   200 mg Oral BID   losartan  100 mg Oral q morning   magnesium  oxide  400 mg Oral BID   [START ON 01/08/2024] pneumococcal 20-valent conjugate vaccine  0.5 mL Intramuscular  Tomorrow-1000   predniSONE   5 mg Oral Daily   sevelamer carbonate  800 mg Oral TID WC   spironolactone  25 mg Oral Daily   tacrolimus  ER  8 mg Oral Daily     Almarie Bonine, MD 01/07/2024, 1:31 PM

## 2024-01-07 NOTE — Care Management Obs Status (Signed)
 MEDICARE OBSERVATION STATUS NOTIFICATION   Patient Details  Name: JOB HOLTSCLAW MRN: [id number] Date of Birth: 02/09/1961   Medicare Observation Status Notification Given:  Yes    Vonzell Arrie Sharps 01/07/2024, 1:10 PM

## 2024-01-07 NOTE — Progress Notes (Signed)
 PROGRESS NOTE  Adam DELEEUW  FMW:[id number] DOB: 12-05-1960 DOA: 01/05/2024 PCP: [facility]   Brief Narrative:  Adam Dillon is a 63 y.o. 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  Symptoms progressively worsen over the last 3 days so he presented to the Emergency Department.  Report of consuming more salt than usual recently.   His  last dialysis was on 01/03/2024 .On presentation, he was severely hypertensive with systolic blood pressure more than 200.  Blood pressure remained high even after dialysis needing  hospitalization.  Planning for titrating the medications and optimization of the blood pressure before discharge to  home  Assessment & Plan:  Principal Problem:   ESRD needing dialysis [facility]) Active Problems:   Renal transplant recipient   Volume overload   Hypertensive urgency   Elevated troponin   Hypertensive urgency: Takes multiple medications at home including Norvasc , labetalol , hydralazine, losartan, doxazosin.  These medications have been restarted.  Remains severely hypertensive even after dialysis.  Continue as needed labetalol .  Added clonidine today.   ESRD on dialysis/history of failed renal transplant: No report of missing dialysis sessions.  Reports adherence with fluid restrictions but may have consumed too much salt recently.  Chest x-Montagna with features of pulmonary edema.  Underwent dialysis on 10/6.  Nephrology following.  Also takes tacrolimus , prednisone  at home.   Elevated troponin: This is secondary to ESRD/severe hypertension.  Troponins were elevated at flat trend.  Chest pain: Low suspicion for acute coronary syndrome.  Troponins elevated flat trend.  D-dimer was found to be slightly better.  VQ scan ordered by night provider        DVT prophylaxis:heparin  injection 5,000  Units Start: 01/06/24 1400     Code Status: Prior  Family Communication: Family member at bedside  Patient status:Obs  Patient is from :Home  Anticipated discharge un:[facility]  Estimated DC date:tomorrow   Consultants: Nephrology  Procedures:Dialysis  Antimicrobials:  Anti-infectives (From admission, onward)    None       Subjective: Patient seen and examined at bedside today.  No chest pain this morning.  Had some chest pain last night.  On room air.  Denied any shortness of breath.  Very concerned about his persistent elevated blood pressure.  Objective: Vitals:   01/06/24 2326 01/07/24 0407 01/07/24 0600 01/07/24 0715  BP: (!) 170/99 (!) 189/94 (!) 175/84 (!) 186/95  Pulse: 73 68  72  Resp: 18 18 11 12   Temp: 98.3 F (36.8 C) 98.2 F (36.8 C)  97.7 F (36.5 C)  TempSrc: Oral Oral  Oral  SpO2: 99% 100%  92%  Weight:      Height:        Intake/Output Summary (Last 24 hours) at 01/07/2024 1114 Last data filed at 01/06/2024 2200 Gross per 24 hour  Intake 120 ml  Output 1500 ml  Net -1380 ml   Filed Weights   01/05/24 2214 01/06/24 2022  Weight: 83.9 kg 74.2 kg    Examination:  General exam: Overall comfortable, not in distress HEENT: PERRL Respiratory system:  no wheezes or crackles  Cardiovascular system: S1 & S2 heard, RRR.  Gastrointestinal system: Abdomen is nondistended, soft and nontender. Central nervous system: Alert and oriented Extremities: No edema, no clubbing ,no cyanosis,AV fistula on the right upper extremity Skin: No rashes, no ulcers,no icterus  Data Reviewed: I have personally reviewed following labs and imaging studies  CBC: Recent Labs  Lab 01/05/24 2250 01/06/24 0756  WBC 5.5 5.4  NEUTROABS 4.7  --   HGB 10.9* 11.1*  HCT 33.0* 32.9*  MCV 105.4* 103.1*  PLT 213 215   Basic Metabolic Panel: Recent Labs  Lab 01/05/24 2250 01/06/24 0757 01/07/24 0355  NA 144 141 137  K 4.1 4.1 4.7  CL 104 99 98  CO2 24 22 28    GLUCOSE 90 85 88  BUN 68* 73* 48*  CREATININE 13.85* 14.77* 11.26*  CALCIUM 8.8* 8.6* 8.6*  PHOS  --  4.8*  --      Recent Results (from the past 240 hours)  Resp panel by RT-PCR (RSV, Flu A&B, Covid) Anterior Nasal Swab     Status: None   Collection Time: 01/05/24 10:50 PM   Specimen: Anterior Nasal Swab  Result Value Ref Range Status   SARS Coronavirus 2 by RT PCR NEGATIVE NEGATIVE Final   Influenza A by PCR NEGATIVE NEGATIVE Final   Influenza B by PCR NEGATIVE NEGATIVE Final    Comment: (NOTE) The Xpert Xpress SARS-CoV-2/FLU/RSV plus assay is intended as an aid in the diagnosis of influenza from Nasopharyngeal swab specimens and should not be used as a sole basis for treatment. Nasal washings and aspirates are unacceptable for Xpert Xpress SARS-CoV-2/FLU/RSV testing.  Fact Sheet for Patients: BloggerCourse.com  Fact Sheet for Healthcare Providers: SeriousBroker.it  This test is not yet approved or cleared by the [place]  FDA and has been authorized for detection and/or diagnosis of SARS-CoV-2 by FDA under an Emergency Use Authorization (EUA). This EUA will remain in effect (meaning this test can be used) for the duration of the COVID-19 declaration under Section 564(b)(1) of the Act, 21 U.S.C. section 360bbb-3(b)(1), unless the authorization is terminated or revoked.     Resp Syncytial Virus by PCR NEGATIVE NEGATIVE Final    Comment: (NOTE) Fact Sheet for Patients: BloggerCourse.com  Fact Sheet for Healthcare Providers: SeriousBroker.it  This test is not yet approved or cleared by the [place]  FDA and has been authorized for detection and/or diagnosis of SARS-CoV-2 by FDA under an Emergency Use Authorization (EUA). This EUA will remain in effect (meaning this test can be used) for the duration of the COVID-19 declaration under Section 564(b)(1) of the Act,  21 U.S.C. section 360bbb-3(b)(1), unless the authorization is terminated or revoked.  Performed at [facility], [address] [place], [place] [address]   MRSA Next Gen by PCR, Nasal     Status: None   Collection Time: 01/07/24 12:48 AM   Specimen: Nasal Mucosa; Nasal Swab  Result Value Ref Range Status   MRSA by PCR Next Gen NOT DETECTED NOT DETECTED Final    Comment: (NOTE) The GeneXpert MRSA Assay (FDA approved for NASAL specimens only), is one component of a comprehensive MRSA colonization surveillance program. It is not intended to diagnose MRSA infection nor to guide or monitor treatment for MRSA infections. Test performance is not FDA approved in patients less than 67 years old. Performed at [facility], [address] [place], [place] [address]      Radiology Studies: DG Chest Port 1 View Result Date: 01/05/2024 EXAM: 1 VIEW(S) XRAY OF THE CHEST 01/05/2024 11:26:51 PM COMPARISON: 10/09/2023 CLINICAL HISTORY: SOB. GC-EMS from home with concern of viral symptoms, suspected fluid overload, and general weakness. Fever. FINDINGS: LUNGS AND PLEURA: Diffuse mild interstitial pulmonary edema, likely cardiogenic in  nature, new since prior examination. No pneumothorax. No pleural effusion. HEART AND MEDIASTINUM: Cardiomegaly. Stable cardiomegaly. BONES AND SOFT TISSUES: No acute osseous abnormality. IMPRESSION: 1. Diffuse mild interstitial pulmonary edema, likely cardiogenic in nature, new since prior examination. 2. Stable cardiomegaly. Electronically signed by: Dorethia Molt MD 01/05/2024 11:55 PM EDT RP Workstation: [id number]    Scheduled Meds:  amLODipine   10 mg Oral Daily   aspirin  EC  81 mg Oral Daily   Chlorhexidine Gluconate Cloth  6 each Topical Q0600   cloNIDine  0.1 mg Oral TID   doxazosin  8 mg Oral Daily   heparin  injection (subcutaneous)  5,000 Units Subcutaneous Q8H   hydrALAZINE  100 mg Oral Q8H   [START ON 01/08/2024] influenza vac split trivalent  PF  0.5 mL Intramuscular Tomorrow-1000   labetalol   200 mg Oral BID   losartan  100 mg Oral q morning   magnesium  oxide  400 mg Oral BID   [START ON 01/08/2024] pneumococcal 20-valent conjugate vaccine  0.5 mL Intramuscular Tomorrow-1000   predniSONE   5 mg Oral Daily   sevelamer carbonate  800 mg Oral TID WC   tacrolimus  ER  8 mg Oral Daily   Continuous Infusions:   LOS: 0 days   Ivonne Mustache, MD [facility] P10/10/2023, 11:14 AM

## 2024-01-08 ENCOUNTER — Observation Stay (HOSPITAL_COMMUNITY)

## 2024-01-08 DIAGNOSIS — E8770 Fluid overload, unspecified: Secondary | ICD-10-CM | POA: Diagnosis present

## 2024-01-08 DIAGNOSIS — T8612 Kidney transplant failure: Secondary | ICD-10-CM | POA: Diagnosis present

## 2024-01-08 DIAGNOSIS — Y838 Other surgical procedures as the cause of abnormal reaction of the patient, or of later complication, without mention of misadventure at the time of the procedure: Secondary | ICD-10-CM | POA: Diagnosis present

## 2024-01-08 DIAGNOSIS — Z7952 Long term (current) use of systemic steroids: Secondary | ICD-10-CM | POA: Diagnosis not present

## 2024-01-08 DIAGNOSIS — Z79899 Other long term (current) drug therapy: Secondary | ICD-10-CM | POA: Diagnosis not present

## 2024-01-08 DIAGNOSIS — I160 Hypertensive urgency: Secondary | ICD-10-CM | POA: Diagnosis present

## 2024-01-08 DIAGNOSIS — N186 End stage renal disease: Secondary | ICD-10-CM | POA: Diagnosis present

## 2024-01-08 DIAGNOSIS — J810 Acute pulmonary edema: Secondary | ICD-10-CM | POA: Diagnosis present

## 2024-01-08 DIAGNOSIS — Z1152 Encounter for screening for COVID-19: Secondary | ICD-10-CM | POA: Diagnosis not present

## 2024-01-08 DIAGNOSIS — Z992 Dependence on renal dialysis: Secondary | ICD-10-CM | POA: Diagnosis not present

## 2024-01-08 DIAGNOSIS — Z888 Allergy status to other drugs, medicaments and biological substances status: Secondary | ICD-10-CM | POA: Diagnosis not present

## 2024-01-08 DIAGNOSIS — I1311 Hypertensive heart and chronic kidney disease without heart failure, with stage 5 chronic kidney disease, or end stage renal disease: Secondary | ICD-10-CM | POA: Diagnosis present

## 2024-01-08 DIAGNOSIS — J811 Chronic pulmonary edema: Secondary | ICD-10-CM | POA: Diagnosis present

## 2024-01-08 DIAGNOSIS — Z5941 Food insecurity: Secondary | ICD-10-CM | POA: Diagnosis not present

## 2024-01-08 DIAGNOSIS — Z7982 Long term (current) use of aspirin: Secondary | ICD-10-CM | POA: Diagnosis not present

## 2024-01-08 DIAGNOSIS — D631 Anemia in chronic kidney disease: Secondary | ICD-10-CM | POA: Diagnosis present

## 2024-01-08 LAB — CBC
HCT: 33.5 % — ABNORMAL LOW (ref 39.0–52.0)
Hemoglobin: 11.8 g/dL — ABNORMAL LOW (ref 13.0–17.0)
MCH: 34.8 pg — ABNORMAL HIGH (ref 26.0–34.0)
MCHC: 35.2 g/dL (ref 30.0–36.0)
MCV: 98.8 fL (ref 80.0–100.0)
Platelets: 210 K/uL (ref 150–400)
RBC: 3.39 MIL/uL — ABNORMAL LOW (ref 4.22–5.81)
RDW: 15.8 % — ABNORMAL HIGH (ref 11.5–15.5)
WBC: 3.7 K/uL — ABNORMAL LOW (ref 4.0–10.5)
nRBC: 0 % (ref 0.0–0.2)

## 2024-01-08 LAB — RENAL FUNCTION PANEL
Albumin: 3.3 g/dL — ABNORMAL LOW (ref 3.5–5.0)
Anion gap: 18 — ABNORMAL HIGH (ref 5–15)
BUN: 71 mg/dL — ABNORMAL HIGH (ref 8–23)
CO2: 22 mmol/L (ref 22–32)
Calcium: 8.6 mg/dL — ABNORMAL LOW (ref 8.9–10.3)
Chloride: 96 mmol/L — ABNORMAL LOW (ref 98–111)
Creatinine, Ser: 13.65 mg/dL — ABNORMAL HIGH (ref 0.61–1.24)
GFR, Estimated: 4 mL/min — ABNORMAL LOW (ref >60)
Glucose, Bld: 86 mg/dL (ref 70–99)
Phosphorus: 4.7 mg/dL — ABNORMAL HIGH (ref 2.5–4.6)
Potassium: 4.5 mmol/L (ref 3.5–5.1)
Sodium: 136 mmol/L (ref 135–145)

## 2024-01-08 MED ORDER — LOSARTAN POTASSIUM 50 MG PO TABS
100.0000 mg | ORAL_TABLET | Freq: Every morning | ORAL | Status: DC
  Administered 2024-01-08 – 2024-01-09 (×2): 100 mg via ORAL
  Filled 2024-01-08 (×2): qty 2

## 2024-01-08 MED ORDER — DOXAZOSIN MESYLATE 8 MG PO TABS
8.0000 mg | ORAL_TABLET | Freq: Every evening | ORAL | Status: DC
  Administered 2024-01-08: 8 mg via ORAL
  Filled 2024-01-08 (×2): qty 1

## 2024-01-08 MED ORDER — AMLODIPINE BESYLATE 10 MG PO TABS
10.0000 mg | ORAL_TABLET | Freq: Every evening | ORAL | Status: DC
  Administered 2024-01-08: 10 mg via ORAL
  Filled 2024-01-08: qty 1

## 2024-01-08 MED ORDER — CARVEDILOL 12.5 MG PO TABS
12.5000 mg | ORAL_TABLET | Freq: Two times a day (BID) | ORAL | Status: DC
  Administered 2024-01-08: 12.5 mg via ORAL
  Filled 2024-01-08: qty 1

## 2024-01-08 MED ORDER — OXYCODONE HCL 5 MG PO TABS
5.0000 mg | ORAL_TABLET | Freq: Four times a day (QID) | ORAL | Status: DC | PRN
  Administered 2024-01-08: 5 mg via ORAL
  Filled 2024-01-08: qty 1

## 2024-01-08 MED ORDER — CARVEDILOL 12.5 MG PO TABS: 12.5000 mg | ORAL_TABLET | Freq: Two times a day (BID) | ORAL | Status: DC

## 2024-01-08 NOTE — Progress Notes (Addendum)
 [facility] Progress Note   Assessment/ Plan:   1. HTNsive urgency: multiple BP meds and per pt mult admissions.  Will do a workup for secondary HTN.   -  ARR and metanephrines pending - searching for RAS with native and transplant kidneys both- both tests are required to adequately evaluate this - change labetalol  to carvedilol, uptitrate to max - spironolactone 25 mg added, uptitrate too - cardura and amlodipine  at bedtime, all others as scheduled for now  2. ESRD: MWF [facility]- HD on schedule today 01/08/24 3. Anemia: Hgb 11.1 4. CKD-MBD: binders and vitamins 5. Nutrition:renal diet 6. Dispo: admitted  Subjective:    Seen in room. BP marginally better with changes but still up.  Anxious about it   Objective:   BP (!) 162/78   Pulse 73   Temp 98.1 F (36.7 C) (Oral)   Resp (!) 0   Ht 6' 1 (1.854 m)   Wt 71 kg   SpO2 98%   BMI 20.65 kg/m   Physical Exam: Gen:NAD CVS:RRR Resp: clear Abd: soft Ext: no LE edema  Labs: BMET Recent Labs  Lab 01/05/24 2250 01/06/24 0757 01/07/24 0355 01/08/24 0859  NA 144 141 137 136  K 4.1 4.1 4.7 4.5  CL 104 99 98 96*  CO2 24 22 28 22   GLUCOSE 90 85 88 86  BUN 68* 73* 48* 71*  CREATININE 13.85* 14.77* 11.26* 13.65*  CALCIUM 8.8* 8.6* 8.6* 8.6*  PHOS  --  4.8*  --  4.7*   CBC Recent Labs  Lab 01/05/24 2250 01/06/24 0756 01/08/24 0859  WBC 5.5 5.4 3.7*  NEUTROABS 4.7  --   --   HGB 10.9* 11.1* 11.8*  HCT 33.0* 32.9* 33.5*  MCV 105.4* 103.1* 98.8  PLT 213 215 210      Medications:     amLODipine   10 mg Oral Daily   aspirin  EC  81 mg Oral Daily   bimatoprost  1 drop Both Eyes QHS   brimonidine  1 drop Both Eyes BID   carvedilol  12.5 mg Oral BID WC   Chlorhexidine Gluconate Cloth  6 each Topical Q0600   doxazosin  8 mg Oral Daily   heparin  injection (subcutaneous)  5,000 Units Subcutaneous Q8H   hydrALAZINE  100 mg Oral Q8H   influenza vac split trivalent PF  0.5 mL Intramuscular  Tomorrow-1000   losartan  100 mg Oral q morning   magnesium  oxide  400 mg Oral BID   pneumococcal 20-valent conjugate vaccine  0.5 mL Intramuscular Tomorrow-1000   predniSONE   5 mg Oral Daily   sevelamer carbonate  800 mg Oral TID WC   spironolactone  25 mg Oral Daily   tacrolimus  ER  8 mg Oral Daily   timolol  1 drop Both Eyes BID     Almarie Bonine, MD 01/08/2024, 11:09 AM

## 2024-01-08 NOTE — Progress Notes (Signed)
 PROGRESS NOTE  Adam Dillon  FMW:[id number] DOB: [birth year] DOA: 01/05/2024 PCP: [facility] [address]   Brief Narrative:  Adam Dillon is a 63 y.o. 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  Symptoms progressively worsen over the last 3 days so he presented to the Emergency Department.  Report of consuming more salt than usual recently.   His  last dialysis was on 01/03/2024 .On presentation, he was severely hypertensive with systolic blood pressure more than 200.  Blood pressure remains high even after dialysis needing  hospitalization.  Planning for titrating the medications and optimization of the blood pressure before discharge to  home  Assessment & Plan:  Principal Problem:   ESRD needing dialysis [facility]) Active Problems:   Renal transplant recipient   Volume overload   Hypertensive urgency   Elevated troponin   Hypertensive urgency: Takes multiple medications at home including Norvasc , labetalol , hydralazine, losartan, doxazosin.  These medications have been restarted.  Remains severely hypertensive even after dialysis.  Nephrology closely following.  Plasma metanephrines, aldosterone/renin ratio pending.  US  renal transplant with Doppler did not show any hydronephrosis, intrarenal mass, patent main renal artery with normal resistive index and patent renal vein.  US  renal artery duplex pending.  Started on spironolactone ,change labetelol  to Coreg.  ESRD on dialysis/history of failed renal transplant: No report of missing dialysis sessions.  Reports adherence with fluid restrictions but may have consumed too much salt recently.  Chest x-[place] with features of pulmonary edema.  Underwent dialysis on 10/6,,10/9.  Nephrology following.  Also takes tacrolimus , prednisone  at home.   Elevated troponin: This is secondary to  ESRD/severe hypertension.  Troponins were elevated at flat trend.  Chest pain: Low suspicion for acute coronary syndrome.  Troponins elevated flat trend.  D-dimer was found to be slightly better.  VQ scan negative        DVT prophylaxis:heparin  injection 5,000 Units Start: 01/06/24 1400     Code Status: Prior  Family Communication: Family member at bedside  Patient status:Obs  Patient is from :Home  Anticipated discharge un:Ynfz  Estimated DC date:after improvement in the blood pressure   Consultants: Nephrology  Procedures:Dialysis  Antimicrobials:  Anti-infectives (From admission, onward)    None       Subjective: Patient seen and examined at bedside today.  Hemodynamically stable.  Very anxious about his blood pressure.  Denies any chest pain or shortness of breath.  On dialysis.  Objective: Vitals:   01/08/24 0930 01/08/24 1000 01/08/24 1030 01/08/24 1100  BP: (!) 191/91 (!) 192/91 (!) 173/93 (!) 162/78  Pulse: 67 74 75 73  Resp: 13 17 18 14   Temp:      TempSrc:      SpO2: 100% 100% 98% 98%  Weight:      Height:        Intake/Output Summary (Last 24 hours) at 01/08/2024 1126 Last data filed at 01/07/2024 2200 Gross per 24 hour  Intake 240 ml  Output --  Net 240 ml   Filed Weights   01/05/24 2214 01/06/24 2022 01/08/24 0854  Weight: 83.9 kg 74.2 kg 71 kg    Examination:   General exam: Overall comfortable, not in distress HEENT: PERRL Respiratory system:  no wheezes or crackles  Cardiovascular system: S1 & S2 heard, RRR.  Gastrointestinal system: Abdomen is nondistended, soft and nontender. Central nervous  system: Alert and oriented Extremities: No edema, no clubbing ,no cyanosis, AV fistula in the right upper extremity Skin: No rashes, no ulcers,no icterus     Data Reviewed: I have personally reviewed following labs and imaging studies  CBC: Recent Labs  Lab 01/05/24 2250 01/06/24 0756 01/08/24 0859  WBC 5.5 5.4 3.7*  NEUTROABS  4.7  --   --   HGB 10.9* 11.1* 11.8*  HCT 33.0* 32.9* 33.5*  MCV 105.4* 103.1* 98.8  PLT 213 215 210   Basic Metabolic Panel: Recent Labs  Lab 01/05/24 2250 01/06/24 0757 01/07/24 0355 01/08/24 0859  NA 144 141 137 136  K 4.1 4.1 4.7 4.5  CL 104 99 98 96*  CO2 24 22 28 22   GLUCOSE 90 85 88 86  BUN 68* 73* 48* 71*  CREATININE 13.85* 14.77* 11.26* 13.65*  CALCIUM 8.8* 8.6* 8.6* 8.6*  PHOS  --  4.8*  --  4.7*     Recent Results (from the past 240 hours)  Resp panel by RT-PCR (RSV, Flu A&B, Covid) Anterior Nasal Swab     Status: None   Collection Time: 01/05/24 10:50 PM   Specimen: Anterior Nasal Swab  Result Value Ref Range Status   SARS Coronavirus 2 by RT PCR NEGATIVE NEGATIVE Final   Influenza A by PCR NEGATIVE NEGATIVE Final   Influenza B by PCR NEGATIVE NEGATIVE Final    Comment: (NOTE) The Xpert Xpress SARS-CoV-2/FLU/RSV plus assay is intended as an aid in the diagnosis of influenza from Nasopharyngeal swab specimens and should not be used as a sole basis for treatment. Nasal washings and aspirates are unacceptable for Xpert Xpress SARS-CoV-2/FLU/RSV testing.  Fact Sheet for Patients: BloggerCourse.com  Fact Sheet for Healthcare Providers: SeriousBroker.it  This test is not yet approved or cleared by the [place]  FDA and has been authorized for detection and/or diagnosis of SARS-CoV-2 by FDA under an Emergency Use Authorization (EUA). This EUA will remain in effect (meaning this test can be used) for the duration of the COVID-19 declaration under Section 564(b)(1) of the Act, 21 U.S.C. section 360bbb-3(b)(1), unless the authorization is terminated or revoked.     Resp Syncytial Virus by PCR NEGATIVE NEGATIVE Final    Comment: (NOTE) Fact Sheet for Patients: BloggerCourse.com  Fact Sheet for Healthcare Providers: SeriousBroker.it  This test is not yet  approved or cleared by the [place]  FDA and has been authorized for detection and/or diagnosis of SARS-CoV-2 by FDA under an Emergency Use Authorization (EUA). This EUA will remain in effect (meaning this test can be used) for the duration of the COVID-19 declaration under Section 564(b)(1) of the Act, 21 U.S.C. section 360bbb-3(b)(1), unless the authorization is terminated or revoked.  Performed at [facility], [address], [place], [place] [address]   MRSA Next Gen by PCR, Nasal     Status: None   Collection Time: 01/07/24 12:48 AM   Specimen: Nasal Mucosa; Nasal Swab  Result Value Ref Range Status   MRSA by PCR Next Gen NOT DETECTED NOT DETECTED Final    Comment: (NOTE) The GeneXpert MRSA Assay (FDA approved for NASAL specimens only), is one component of a comprehensive MRSA colonization surveillance program. It is not intended to diagnose MRSA infection nor to guide or monitor treatment for MRSA infections. Test performance is not FDA approved in patients less than 49 years old. Performed at [facility], [address], [place], [place] [address]      Radiology Studies: US   Renal Transplant w/Doppler Result Date: 01/07/2024 EXAM: US  Retroperitoneum Complete, Renal. CLINICAL HISTORY: HTN (Hypertension) (Hypertension). TECHNIQUE: Real-time ultrasound of the retroperitoneum (complete) with image documentation. COMPARISON: Comparison made to prior examination of March 04, 2021. FINDINGS: RIGHT KIDNEY: The right lower quadrant transplant kidney has normal cortical thickness and echogenicity, with preserved size, measuring 6.6 x 4.9 x 4.1 cm, and an estimated volume of 69 cc. No hydronephrosis, intrarenal masses, or calcifications are visualized. LEFT KIDNEY: The native kidneys are not imaged on this examination. BLADDER: The bladder is obscured by overlying bowel gas. VASCULATURE: The main renal artery is patent and demonstrates a normal resistive index of 0.70.  Intraparenchymal resistive indices within the upper pole and lower pole are 0.78 and 0.78, respectively. The renal vein is patent. IMPRESSION: 1. No hydronephrosis or intrarenal masses in the right lower quadrant transplant kidney. 2. Patent main renal artery with normal resistive index and patent renal vein. Electronically signed by: Dorethia Molt MD 01/07/2024 10:09 PM EDT RP Workstation: [id number]   NM Pulmonary Perfusion Result Date: 01/07/2024 EXAM: NM Lung Perfusion Scan. CLINICAL HISTORY: Pulmonary embolism (PE) suspected, high prob. NM Pulmonary Perfusion Study:; 4.4 mCi Tc54m MAA existing IV left arm (existing) hb; *PE suspected, High Probability TECHNIQUE: Radiolabeled MAA was administered intravenously and planar images of the lungs were obtained in multiple projections. RADIOPHARMACEUTICAL: 4.4 mCi Tc23m MAA. COMPARISON: Chest radiograph 01/07/2024. FINDINGS: PERFUSION: No wedge shaped peripheral perfusion defect with left or right lung. Normal perfusion scan. IMPRESSION: 1. Normal perfusion scan with no segmental or subsegmental perfusion defects to indicate pulmonary embolism. Electronically signed by: Norleen Boxer MD 01/07/2024 04:09 PM EDT RP Workstation: [id number]   DG CHEST PORT 1 VIEW Result Date: 01/07/2024 CLINICAL DATA:  Chest pain. EXAM: PORTABLE CHEST 1 VIEW COMPARISON:  01/05/2024.  Abdomen and pelvis CT dated 10/08/2023. FINDINGS: Stable enlarged cardiac silhouette with a globular configuration. This is unchanged compared to the scout CT images dated 10/02/2023 when there was only a small pericardial effusion. Clear lungs with normal vascularity. Thoracic spine degenerative changes. IMPRESSION: 1. No acute abnormality. 2. Stable cardiomegaly. Electronically Signed   By: Elspeth Bathe M.D.   On: 01/07/2024 15:03    Scheduled Meds:  amLODipine   10 mg Oral QPM   aspirin  EC  81 mg Oral Daily   bimatoprost  1 drop Both Eyes QHS   brimonidine  1 drop Both Eyes BID   carvedilol   12.5 mg Oral BID WC   Chlorhexidine Gluconate Cloth  6 each Topical Q0600   doxazosin  8 mg Oral QPM   heparin  injection (subcutaneous)  5,000 Units Subcutaneous Q8H   hydrALAZINE  100 mg Oral Q8H   influenza vac split trivalent PF  0.5 mL Intramuscular Tomorrow-1000   losartan  100 mg Oral q morning   magnesium  oxide  400 mg Oral BID   pneumococcal 20-valent conjugate vaccine  0.5 mL Intramuscular Tomorrow-1000   predniSONE   5 mg Oral Daily   sevelamer carbonate  800 mg Oral TID WC   spironolactone  25 mg Oral Daily   tacrolimus  ER  8 mg Oral Daily   timolol  1 drop Both Eyes BID   Continuous Infusions:   LOS: 0 days   Ivonne Mustache, MD [facility] P10/11/2023, 11:26 AM

## 2024-01-08 NOTE — Procedures (Signed)
 Patient seen and examined on Hemodialysis. The procedure was supervised and I have made appropriate changes. BP (!) 162/78   Pulse 73   Temp 98.1 F (36.7 C) (Oral)   Resp (!) 0   Ht 6' 1 (1.854 m)   Wt 71 kg   SpO2 98%   BMI 20.65 kg/m   QB 400 mL/ min via AVF, UF goal 2L  Tolerating treatment without complaints at this time.   Adam Bonine MD [facility]gr [phone] 11:15 AM

## 2024-01-08 NOTE — Progress Notes (Signed)
   01/08/24 1245  Vitals  Temp 97.6 F (36.4 C)  Pulse Rate 73  Resp 12  BP (!) 194/92  SpO2 100 %  O2 Device Room Air  Weight 67.5 kg  Type of Weight Post-Dialysis  Post Treatment  Dialyzer Clearance Lightly streaked  Hemodialysis Intake (mL) 0 mL  Liters Processed 73.5  Fluid Removed (mL) 2800 mL  Tolerated HD Treatment Yes  AVG/AVF Arterial Site Held (minutes) 10 minutes  AVG/AVF Venous Site Held (minutes) 10 minutes   Received patient in bed to unit.  Alert and oriented.  Informed consent signed and in chart.   TX duration:3.5HRS  Patient tolerated well.  Transported back to the room  Alert, without acute distress.  Hand-off given to patient's nurse.   Access used: RAVF Access issues: NONE  Total UF removed: 2.8L Medication(s) given: NONE   [facility]

## 2024-01-09 ENCOUNTER — Other Ambulatory Visit (HOSPITAL_COMMUNITY): Payer: Self-pay

## 2024-01-09 MED ORDER — CARVEDILOL 25 MG PO TABS
25.0000 mg | ORAL_TABLET | Freq: Two times a day (BID) | ORAL | 0 refills | Status: AC
  Filled 2024-01-09: qty 120, 60d supply, fill #0

## 2024-01-09 MED ORDER — LOSARTAN POTASSIUM 100 MG PO TABS: 100.0000 mg | ORAL_TABLET | Freq: Every evening | ORAL | Status: AC

## 2024-01-09 MED ORDER — CARVEDILOL 25 MG PO TABS
25.0000 mg | ORAL_TABLET | Freq: Two times a day (BID) | ORAL | Status: DC
  Administered 2024-01-09: 25 mg via ORAL
  Filled 2024-01-09: qty 1

## 2024-01-09 MED ORDER — AMLODIPINE BESYLATE 10 MG PO TABS
10.0000 mg | ORAL_TABLET | Freq: Every evening | ORAL | 0 refills | Status: AC
  Filled 2024-01-09: qty 60, 60d supply, fill #0

## 2024-01-09 MED ORDER — SEVELAMER CARBONATE 800 MG PO TABS
800.0000 mg | ORAL_TABLET | Freq: Three times a day (TID) | ORAL | 0 refills | Status: AC
  Filled 2024-01-09: qty 90, 30d supply, fill #0

## 2024-01-09 MED ORDER — SPIRONOLACTONE 25 MG PO TABS
25.0000 mg | ORAL_TABLET | Freq: Every day | ORAL | 0 refills | Status: AC
  Filled 2024-01-09: qty 60, 60d supply, fill #0

## 2024-01-09 NOTE — Discharge Summary (Signed)
 Physician Discharge Summary  Adam Dillon FMW:[id number] DOB: 02-10-1961 DOA: 01/05/2024  PCP: [facility] [address]  Admit date: 01/05/2024 Discharge date: 01/09/2024  Admitted From: Home Disposition:  Home  Discharge Condition:Stable CODE STATUS:FULL Diet recommendation: Renal  Brief/Interim Summary: Adam Dillon is a 63 y.o. 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  Symptoms progressively worsen over the last 3 days so he presented to the Emergency Department.  Report of consuming more salt than usual recently.   His  last dialysis was on 01/03/2024 .On presentation, he was severely hypertensive with systolic blood pressure more than .  Blood pressure remained high even after dialysis needing  hospitalization.  After titration of medications, blood pressure is finally better.  Medically stable for discharge home today, cleared by nephrology for discharge.  Following problems were addressed during the hospitalization:  Hypertensive urgency: Takes multiple medications at home including Norvasc , labetalol , hydralazine, losartan, doxazosin.    Remained severely hypertensive even after dialysis.  Nephrology closely following.  Plasma metanephrines, aldosterone/renin ratio pending.  US  renal transplant with Doppler did not show any hydronephrosis, intrarenal mass, patent main renal artery with normal resistive index and patent renal vein.   Started on spironolactone ,change labetelol  to Coreg.  Blood pressure better.   ESRD on dialysis/history of failed renal transplant: No report of missing dialysis sessions.  Reports adherence with fluid restrictions but may have consumed too much salt recently.  Chest x-Large with features of pulmonary edema.  Underwent dialysis on 10/6,,10/9.  Nephrology following.  Also takes tacrolimus ,  prednisone  at home.   Elevated troponin: This is secondary to ESRD/severe hypertension.  Troponins were elevated at flat trend.   Chest pain: Low suspicion for acute coronary syndrome.  Troponins elevated flat trend.  D-dimer was found to be slightly better.  VQ scan negative       Discharge Diagnoses:  Principal Problem:   ESRD needing dialysis [facility]) Active Problems:   Renal transplant recipient   Volume overload   Hypertensive urgency   Elevated troponin    Discharge Instructions  Discharge Instructions     Diet renal/carb modified with fluid restriction   Complete by: As directed    Discharge instructions   Complete by: As directed    1)Please take your medications as instructed 2)Follow up with your PCP and nephrologist as an outpatient. 3)Monitor blood pressure at home   Increase activity slowly   Complete by: As directed       Allergies as of 01/09/2024       Reactions   Baclofen Other (See Comments)   Other Reaction(s): Confusion, Confusion, History Unknown Patient unable to tolerate baclofen d/t ESRD status. Patient admitted to hospital d/t AMS with combative behavior requiring emergent HD.   Hydralazine    Orthostatic hypotension        Medication List     STOP taking these medications    labetalol  200 MG tablet Commonly known as: NORMODYNE        TAKE these medications    acetaminophen  500 MG tablet Commonly known as: TYLENOL  Take 1,000 mg by mouth every 6 (six) hours as needed for mild pain (pain score 1-3) or headache.   amLODipine  10 MG tablet Commonly known as: NORVASC  Take 1 tablet (10 mg total) by mouth every evening. What changed:  medication strength how much to take when to  take this   aspirin  EC 81 MG tablet Take 81 mg by mouth daily.   carvedilol 25 MG tablet Commonly known as: COREG Take 1 tablet (25 mg total) by mouth 2 (two) times daily.   Combigan 0.2-0.5 % ophthalmic solution Generic drug:  brimonidine-timolol Apply 1 drop to eye 2 (two) times daily.   doxazosin 8 MG tablet Commonly known as: CARDURA Take 8 mg by mouth daily.   Envarsus  XR 4 MG Tb24 Generic drug: tacrolimus  ER Take 8 mg by mouth daily.   hydrALAZINE 100 MG tablet Commonly known as: APRESOLINE Take 100 mg by mouth 3 (three) times daily.   losartan 100 MG tablet Commonly known as: COZAAR Take 100 mg by mouth every morning.   Lumigan 0.01 % Soln Generic drug: bimatoprost Place 1 drop into both eyes at bedtime.   magnesium  oxide 400 MG tablet Commonly known as: MAG-OX Take 400 mg by mouth 2 (two) times daily.   predniSONE  5 MG tablet Commonly known as: DELTASONE  Take 5 mg by mouth daily.   sevelamer carbonate 800 MG tablet Commonly known as: RENVELA Take 1 tablet (800 mg total) by mouth 3 (three) times daily with meals.   spironolactone 25 MG tablet Commonly known as: ALDACTONE Take 1 tablet (25 mg total) by mouth daily. Start taking on: January 10, 2024        Follow-up Information     [facility]. Schedule an appointment as soon as possible for a visit in 1 week(s).   Contact information: [address] [place] [address] [phone]                Allergies  Allergen Reactions   Baclofen Other (See Comments)    Other Reaction(s): Confusion, Confusion, History Unknown  Patient unable to tolerate baclofen d/t ESRD status. Patient admitted to hospital d/t AMS with combative behavior requiring emergent HD.   Hydralazine     Orthostatic hypotension    Consultations: Nephrology   Procedures/Studies: US  Renal Transplant w/Doppler Result Date: 01/07/2024 EXAM: US  Retroperitoneum Complete, Renal. CLINICAL HISTORY: HTN (Hypertension) (Hypertension). TECHNIQUE: Real-time ultrasound of the retroperitoneum (complete) with image documentation. COMPARISON: Comparison made to prior examination of March 04, 2021. FINDINGS: RIGHT KIDNEY: The right lower quadrant  transplant kidney has normal cortical thickness and echogenicity, with preserved size, measuring 6.6 x 4.9 x 4.1 cm, and an estimated volume of 69 cc. No hydronephrosis, intrarenal masses, or calcifications are visualized. LEFT KIDNEY: The native kidneys are not imaged on this examination. BLADDER: The bladder is obscured by overlying bowel gas. VASCULATURE: The main renal artery is patent and demonstrates a normal resistive index of 0.70. Intraparenchymal resistive indices within the upper pole and lower pole are 0.78 and 0.78, respectively. The renal vein is patent. IMPRESSION: 1. No hydronephrosis or intrarenal masses in the right lower quadrant transplant kidney. 2. Patent main renal artery with normal resistive index and patent renal vein. Electronically signed by: Dorethia Molt MD 01/07/2024 10:09 PM EDT RP Workstation: [id number]   NM Pulmonary Perfusion Result Date: 01/07/2024 EXAM: NM Lung Perfusion Scan. CLINICAL HISTORY: Pulmonary embolism (PE) suspected, high prob. NM Pulmonary Perfusion Study:; 4.4 mCi Tc74m MAA existing IV left arm (existing) hb; *PE suspected, High Probability TECHNIQUE: Radiolabeled MAA was administered intravenously and planar images of the lungs were obtained in multiple projections. RADIOPHARMACEUTICAL: 4.4 mCi Tc60m MAA. COMPARISON: Chest radiograph 01/07/2024. FINDINGS: PERFUSION: No wedge shaped peripheral perfusion defect with left or right lung. Normal perfusion scan. IMPRESSION: 1. Normal perfusion scan  with no segmental or subsegmental perfusion defects to indicate pulmonary embolism. Electronically signed by: Norleen Boxer MD 01/07/2024 04:09 PM EDT RP Workstation: [id number]   DG CHEST PORT 1 VIEW Result Date: 01/07/2024 CLINICAL DATA:  Chest pain. EXAM: PORTABLE CHEST 1 VIEW COMPARISON:  01/05/2024.  Abdomen and pelvis CT dated 10/08/2023. FINDINGS: Stable enlarged cardiac silhouette with a globular configuration. This is unchanged compared to the scout CT images  dated 10/02/2023 when there was only a small pericardial effusion. Clear lungs with normal vascularity. Thoracic spine degenerative changes. IMPRESSION: 1. No acute abnormality. 2. Stable cardiomegaly. Electronically Signed   By: Elspeth Bathe M.D.   On: 01/07/2024 15:03   DG Chest Port 1 View Result Date: 01/05/2024 EXAM: 1 VIEW(S) XRAY OF THE CHEST 01/05/2024 11:26:51 PM COMPARISON: 10/09/2023 CLINICAL HISTORY: SOB. GC-EMS from home with concern of viral symptoms, suspected fluid overload, and general weakness. Fever. FINDINGS: LUNGS AND PLEURA: Diffuse mild interstitial pulmonary edema, likely cardiogenic in nature, new since prior examination. No pneumothorax. No pleural effusion. HEART AND MEDIASTINUM: Cardiomegaly. Stable cardiomegaly. BONES AND SOFT TISSUES: No acute osseous abnormality. IMPRESSION: 1. Diffuse mild interstitial pulmonary edema, likely cardiogenic in nature, new since prior examination. 2. Stable cardiomegaly. Electronically signed by: Dorethia Molt MD 01/05/2024 11:55 PM EDT RP Workstation: [id number]      Subjective: Patient seen and examined at bedside today.  Feels much better today.  Blood pressure is better today with systolic in the range of 160s.  We discussed with nephrology and he has been cleared for discharge.  He feels ready to go home.  Discharge Exam: Vitals:   01/09/24 0351 01/09/24 0732  BP: (!) 178/89 (!) 179/93  Pulse: 74 72  Resp: 14 13  Temp: 98 F (36.7 C) 97.9 F (36.6 C)  SpO2:  99%   Vitals:   01/08/24 2054 01/09/24 0021 01/09/24 0351 01/09/24 0732  BP: (!) 179/88 (!) 164/75 (!) 178/89 (!) 179/93  Pulse: 69 73 74 72  Resp: 18 18 14 13   Temp: 98.1 F (36.7 C) 97.8 F (36.6 C) 98 F (36.7 C) 97.9 F (36.6 C)  TempSrc: Oral Oral Oral Oral  SpO2: 99% 98%  99%  Weight:      Height:        General: Pt is alert, awake, not in acute distress Cardiovascular: RRR, S1/S2 +, no rubs, no gallops Respiratory: CTA bilaterally, no wheezing, no  rhonchi Abdominal: Soft, NT, ND, bowel sounds + Extremities: no edema, no cyanosis, AV fistula on the right upper extremity    The results of significant diagnostics from this hospitalization (including imaging, microbiology, ancillary and laboratory) are listed below for reference.     Microbiology: Recent Results (from the past 240 hours)  Resp panel by RT-PCR (RSV, Flu A&B, Covid) Anterior Nasal Swab     Status: None   Collection Time: 01/05/24 10:50 PM   Specimen: Anterior Nasal Swab  Result Value Ref Range Status   SARS Coronavirus 2 by RT PCR NEGATIVE NEGATIVE Final   Influenza A by PCR NEGATIVE NEGATIVE Final   Influenza B by PCR NEGATIVE NEGATIVE Final    Comment: (NOTE) The Xpert Xpress SARS-CoV-2/FLU/RSV plus assay is intended as an aid in the diagnosis of influenza from Nasopharyngeal swab specimens and should not be used as a sole basis for treatment. Nasal washings and aspirates are unacceptable for Xpert Xpress SARS-CoV-2/FLU/RSV testing.  Fact Sheet for Patients: BloggerCourse.com  Fact Sheet for Healthcare Providers: SeriousBroker.it  This test is not yet approved  or cleared by the [place]  FDA and has been authorized for detection and/or diagnosis of SARS-CoV-2 by FDA under an Emergency Use Authorization (EUA). This EUA will remain in effect (meaning this test can be used) for the duration of the COVID-19 declaration under Section 564(b)(1) of the Act, 21 U.S.C. section 360bbb-3(b)(1), unless the authorization is terminated or revoked.     Resp Syncytial Virus by PCR NEGATIVE NEGATIVE Final    Comment: (NOTE) Fact Sheet for Patients: BloggerCourse.com  Fact Sheet for Healthcare Providers: SeriousBroker.it  This test is not yet approved or cleared by the [place]  FDA and has been authorized for detection and/or diagnosis of SARS-CoV-2 by FDA  under an Emergency Use Authorization (EUA). This EUA will remain in effect (meaning this test can be used) for the duration of the COVID-19 declaration under Section 564(b)(1) of the Act, 21 U.S.C. section 360bbb-3(b)(1), unless the authorization is terminated or revoked.  Performed at [facility], [address], [place], [place] [address]   MRSA Next Gen by PCR, Nasal     Status: None   Collection Time: 01/07/24 12:48 AM   Specimen: Nasal Mucosa; Nasal Swab  Result Value Ref Range Status   MRSA by PCR Next Gen NOT DETECTED NOT DETECTED Final    Comment: (NOTE) The GeneXpert MRSA Assay (FDA approved for NASAL specimens only), is one component of a comprehensive MRSA colonization surveillance program. It is not intended to diagnose MRSA infection nor to guide or monitor treatment for MRSA infections. Test performance is not FDA approved in patients less than 70 years old. Performed at [facility], [address] [place], [place] [address]      Labs: BNP (last 3 results) Recent Labs    01/05/24 2250  BNP >4,500.0*   Basic Metabolic Panel: Recent Labs  Lab 01/05/24 2250 01/06/24 0757 01/07/24 0355 01/08/24 0859  NA 144 141 137 136  K 4.1 4.1 4.7 4.5  CL 104 99 98 96*  CO2 24 22 28 22   GLUCOSE 90 85 88 86  BUN 68* 73* 48* 71*  CREATININE 13.85* 14.77* 11.26* 13.65*  CALCIUM 8.8* 8.6* 8.6* 8.6*  PHOS  --  4.8*  --  4.7*   Liver Function Tests: Recent Labs  Lab 01/05/24 2250 01/06/24 0757 01/08/24 0859  AST 20  --   --   ALT 13  --   --   ALKPHOS 132*  --   --   BILITOT 0.9  --   --   PROT 6.7  --   --   ALBUMIN 3.5 3.4* 3.3*   No results for input(s): LIPASE, AMYLASE in the last 168 hours. No results for input(s): AMMONIA in the last 168 hours. CBC: Recent Labs  Lab 01/05/24 2250 01/06/24 0756 01/08/24 0859  WBC 5.5 5.4 3.7*  NEUTROABS 4.7  --   --   HGB 10.9* 11.1* 11.8*  HCT 33.0* 32.9* 33.5*  MCV 105.4* 103.1* 98.8  PLT  213 215 210   Cardiac Enzymes: No results for input(s): CKTOTAL, CKMB, CKMBINDEX, TROPONINI in the last 168 hours. BNP: Invalid input(s): POCBNP CBG: No results for input(s): GLUCAP in the last 168 hours. D-Dimer Recent Labs    01/06/24 2054  DDIMER 1.14*   Hgb A1c No results for input(s): HGBA1C in the last 72 hours. Lipid Profile No results for input(s): CHOL, HDL, LDLCALC, TRIG, CHOLHDL, LDLDIRECT in the last 72 hours. Thyroid function studies No results for input(s): TSH, T4TOTAL, T3FREE,  THYROIDAB in the last 72 hours.  Invalid input(s): FREET3 Anemia work up No results for input(s): VITAMINB12, FOLATE, FERRITIN, TIBC, IRON, RETICCTPCT in the last 72 hours. Urinalysis    Component Value Date/Time   COLORURINE YELLOW 09/15/2019 1755   APPEARANCEUR CLEAR 09/15/2019 1755   LABSPEC 1.009 09/15/2019 1755   PHURINE 5.0 09/15/2019 1755   GLUCOSEU NEGATIVE 09/15/2019 1755   HGBUR MODERATE (A) 09/15/2019 1755   BILIRUBINUR NEGATIVE 09/15/2019 1755   KETONESUR NEGATIVE 09/15/2019 1755   PROTEINUR >=300 (A) 09/15/2019 1755   UROBILINOGEN 0.2 07/30/2014 1320   NITRITE NEGATIVE 09/15/2019 1755   LEUKOCYTESUR NEGATIVE 09/15/2019 1755   Sepsis Labs Recent Labs  Lab 01/05/24 2250 01/06/24 0756 01/08/24 0859  WBC 5.5 5.4 3.7*   Microbiology Recent Results (from the past 240 hours)  Resp panel by RT-PCR (RSV, Flu A&B, Covid) Anterior Nasal Swab     Status: None   Collection Time: 01/05/24 10:50 PM   Specimen: Anterior Nasal Swab  Result Value Ref Range Status   SARS Coronavirus 2 by RT PCR NEGATIVE NEGATIVE Final   Influenza A by PCR NEGATIVE NEGATIVE Final   Influenza B by PCR NEGATIVE NEGATIVE Final    Comment: (NOTE) The Xpert Xpress SARS-CoV-2/FLU/RSV plus assay is intended as an aid in the diagnosis of influenza from Nasopharyngeal swab specimens and should not be used as a sole basis for treatment. Nasal washings  and aspirates are unacceptable for Xpert Xpress SARS-CoV-2/FLU/RSV testing.  Fact Sheet for Patients: BloggerCourse.com  Fact Sheet for Healthcare Providers: SeriousBroker.it  This test is not yet approved or cleared by the [place]  FDA and has been authorized for detection and/or diagnosis of SARS-CoV-2 by FDA under an Emergency Use Authorization (EUA). This EUA will remain in effect (meaning this test can be used) for the duration of the COVID-19 declaration under Section 564(b)(1) of the Act, 21 U.S.C. section 360bbb-3(b)(1), unless the authorization is terminated or revoked.     Resp Syncytial Virus by PCR NEGATIVE NEGATIVE Final    Comment: (NOTE) Fact Sheet for Patients: BloggerCourse.com  Fact Sheet for Healthcare Providers: SeriousBroker.it  This test is not yet approved or cleared by the [place]  FDA and has been authorized for detection and/or diagnosis of SARS-CoV-2 by FDA under an Emergency Use Authorization (EUA). This EUA will remain in effect (meaning this test can be used) for the duration of the COVID-19 declaration under Section 564(b)(1) of the Act, 21 U.S.C. section 360bbb-3(b)(1), unless the authorization is terminated or revoked.  Performed at [facility], [address] [place], [place] [address]   MRSA Next Gen by PCR, Nasal     Status: None   Collection Time: 01/07/24 12:48 AM   Specimen: Nasal Mucosa; Nasal Swab  Result Value Ref Range Status   MRSA by PCR Next Gen NOT DETECTED NOT DETECTED Final    Comment: (NOTE) The GeneXpert MRSA Assay (FDA approved for NASAL specimens only), is one component of a comprehensive MRSA colonization surveillance program. It is not intended to diagnose MRSA infection nor to guide or monitor treatment for MRSA infections. Test performance is not FDA approved in patients less than 48  years old. Performed at [facility], [address] [place], [place] [address]     Please note: You were cared for by a hospitalist during your hospital stay. Once you are discharged, your primary care physician will handle any further medical issues. Please note that NO REFILLS for any discharge medications will  be authorized once you are discharged, as it is imperative that you return to your primary care physician (or establish a relationship with a primary care physician if you do not have one) for your post hospital discharge needs so that they can reassess your need for medications and monitor your lab values.    Time coordinating discharge: 40 minutes  SIGNED:   Ivonne Mustache, MD  [facility] 01/09/2024, 10:32 AM Pager [phone]  If 7PM-7AM, please contact night-coverage [email or web address] Password [email or web address]

## 2024-01-09 NOTE — Progress Notes (Signed)
 Pt discharging home with family.  All instructions given and reviewed, all questions answered.  Will cont volunteers and instruct to stop at [facility] pharm when Pt is dressed.

## 2024-01-09 NOTE — Progress Notes (Signed)
 Med details list completed..  Primary RN to review AVS and discharge.

## 2024-01-09 NOTE — Progress Notes (Signed)
 D/c orders noted. Contacted out-pt Hd clinic, [facility], to inform of d/c and to anticipate pt back at clinic tomorrow. D/c summary and last nephrology note have been faxed over at this time. No further support needed at this time.    Adam Dillon Dialysis Navigator [phone]

## 2024-01-09 NOTE — Progress Notes (Signed)
 [facility] Progress Note   Assessment/ Plan:   1. HTNsive urgency: multiple BP meds and per pt mult admissions.  Will do a workup for secondary HTN.   -  ARR and metanephrines pending - transplant doppler with no RAS, recommend OP native kidney US  to check for RAS as well - change labetalol  to carvedilol, uptitrated to 25 mg BID - spironolactone 25 mg added can uptitrate as OP as well - cardura and amlodipine  at bedtime, all others as scheduled for now  2. ESRD: MWF [facility] [place] [facility]- HD on schedule.  Can do next 10/10 as OP 3. Anemia: Hgb 11.1 4. CKD-MBD: binders and vitamins 5. Nutrition:renal diet 6. Dispo: admitted  Subjective:    Seen in room. S/p HD yesterday with 2.8L off.  Did well.  Bp meds adjusted- is better.  Pt feeling better and eager for d/c.   Objective:   BP (!) 179/93 (BP Location: Left Arm)   Pulse 72   Temp 97.9 F (36.6 C) (Oral)   Resp 13   Ht 6' 1 (1.854 m)   Wt 67.5 kg   SpO2 99%   BMI 19.63 kg/m   Physical Exam: Gen:NAD CVS:RRR Resp: clear Abd: soft Ext: no LE edema  Labs: BMET Recent Labs  Lab 01/05/24 2250 01/06/24 0757 01/07/24 0355 01/08/24 0859  NA 144 141 137 136  K 4.1 4.1 4.7 4.5  CL 104 99 98 96*  CO2 24 22 28 22   GLUCOSE 90 85 88 86  BUN 68* 73* 48* 71*  CREATININE 13.85* 14.77* 11.26* 13.65*  CALCIUM 8.8* 8.6* 8.6* 8.6*  PHOS  --  4.8*  --  4.7*   CBC Recent Labs  Lab 01/05/24 2250 01/06/24 0756 01/08/24 0859  WBC 5.5 5.4 3.7*  NEUTROABS 4.7  --   --   HGB 10.9* 11.1* 11.8*  HCT 33.0* 32.9* 33.5*  MCV 105.4* 103.1* 98.8  PLT 213 215 210      Medications:     amLODipine   10 mg Oral QPM   aspirin  EC  81 mg Oral Daily   bimatoprost  1 drop Both Eyes QHS   brimonidine  1 drop Both Eyes BID   carvedilol  25 mg Oral BID   Chlorhexidine Gluconate Cloth  6 each Topical Q0600   doxazosin  8 mg Oral QPM   heparin  injection (subcutaneous)  5,000 Units Subcutaneous Q8H   hydrALAZINE  100 mg Oral  Q8H   influenza vac split trivalent PF  0.5 mL Intramuscular Tomorrow-1000   losartan  100 mg Oral q morning   magnesium  oxide  400 mg Oral BID   pneumococcal 20-valent conjugate vaccine  0.5 mL Intramuscular Tomorrow-1000   predniSONE   5 mg Oral Daily   sevelamer carbonate  800 mg Oral TID WC   spironolactone  25 mg Oral Daily   tacrolimus  ER  8 mg Oral Daily   timolol  1 drop Both Eyes BID     Almarie Bonine, MD 01/09/2024, 11:12 AM

## 2024-01-11 LAB — ALDOSTERONE + RENIN ACTIVITY W/ RATIO
ALDO / PRA Ratio: 0.8 (ref 0.0–30.0)
Aldosterone: 1.5 ng/dL (ref 0.0–30.0)
PRA LC/MS/MS: 1.928 ng/mL/h (ref 0.167–5.380)

## 2024-01-11 LAB — TACROLIMUS LEVEL: Tacrolimus (FK506) - LabCorp: 2.1 ng/mL — ABNORMAL LOW (ref 5.0–20.0)

## 2024-01-15 LAB — METANEPHRINES, PLASMA

## 2024-04-07 ENCOUNTER — Encounter: Payer: Self-pay | Admitting: Internal Medicine
